# Patient Record
Sex: Female | Born: 1960 | Race: White | Hispanic: No | Smoking: Never smoker
Health system: Southern US, Community
[De-identification: ages and names within clinical notes are randomized; demographics above are authoritative.]

## PROBLEM LIST (undated history)

## (undated) DIAGNOSIS — Z8619 Personal history of other infectious and parasitic diseases: Secondary | ICD-10-CM

## (undated) DIAGNOSIS — N318 Other neuromuscular dysfunction of bladder: Secondary | ICD-10-CM

## (undated) DIAGNOSIS — L409 Psoriasis, unspecified: Secondary | ICD-10-CM

## (undated) DIAGNOSIS — R197 Diarrhea, unspecified: Secondary | ICD-10-CM

## (undated) DIAGNOSIS — N3281 Overactive bladder: Secondary | ICD-10-CM

## (undated) DIAGNOSIS — E669 Obesity, unspecified: Secondary | ICD-10-CM

## (undated) DIAGNOSIS — M1990 Unspecified osteoarthritis, unspecified site: Secondary | ICD-10-CM

## (undated) DIAGNOSIS — K219 Gastro-esophageal reflux disease without esophagitis: Secondary | ICD-10-CM

## (undated) HISTORY — DX: Obesity, unspecified: E66.9

## (undated) HISTORY — PX: OTHER SURGICAL HISTORY: SHX169

## (undated) HISTORY — DX: Overactive bladder: N32.81

## (undated) HISTORY — DX: Unspecified osteoarthritis, unspecified site: M19.90

## (undated) HISTORY — DX: Personal history of other infectious and parasitic diseases: Z86.19

## (undated) HISTORY — PX: KNEE ARTHROSCOPY: SUR90

## (undated) HISTORY — DX: Gastro-esophageal reflux disease without esophagitis: K21.9

---

## 1999-04-29 ENCOUNTER — Encounter: Payer: Self-pay | Admitting: Family Medicine

## 1999-04-29 ENCOUNTER — Encounter: Admission: RE | Admit: 1999-04-29 | Discharge: 1999-04-29 | Payer: Self-pay | Admitting: Family Medicine

## 1999-05-05 ENCOUNTER — Other Ambulatory Visit: Admission: RE | Admit: 1999-05-05 | Discharge: 1999-05-05 | Payer: Self-pay | Admitting: *Deleted

## 1999-05-12 ENCOUNTER — Encounter: Admission: RE | Admit: 1999-05-12 | Discharge: 1999-05-12 | Payer: Self-pay | Admitting: Family Medicine

## 1999-05-12 ENCOUNTER — Encounter: Payer: Self-pay | Admitting: Family Medicine

## 2000-04-08 ENCOUNTER — Encounter: Payer: Self-pay | Admitting: *Deleted

## 2000-04-08 ENCOUNTER — Encounter: Admission: RE | Admit: 2000-04-08 | Discharge: 2000-04-08 | Payer: Self-pay | Admitting: *Deleted

## 2000-05-04 ENCOUNTER — Other Ambulatory Visit: Admission: RE | Admit: 2000-05-04 | Discharge: 2000-05-04 | Payer: Self-pay | Admitting: *Deleted

## 2000-05-05 ENCOUNTER — Encounter (INDEPENDENT_AMBULATORY_CARE_PROVIDER_SITE_OTHER): Payer: Self-pay

## 2000-05-05 ENCOUNTER — Other Ambulatory Visit: Admission: RE | Admit: 2000-05-05 | Discharge: 2000-05-05 | Payer: Self-pay | Admitting: *Deleted

## 2001-04-11 ENCOUNTER — Encounter: Admission: RE | Admit: 2001-04-11 | Discharge: 2001-04-11 | Payer: Self-pay | Admitting: *Deleted

## 2001-04-11 ENCOUNTER — Encounter: Payer: Self-pay | Admitting: *Deleted

## 2001-06-21 ENCOUNTER — Other Ambulatory Visit: Admission: RE | Admit: 2001-06-21 | Discharge: 2001-06-21 | Payer: Self-pay | Admitting: *Deleted

## 2002-04-17 ENCOUNTER — Encounter: Admission: RE | Admit: 2002-04-17 | Discharge: 2002-04-17 | Payer: Self-pay | Admitting: Family Medicine

## 2002-04-17 ENCOUNTER — Encounter: Payer: Self-pay | Admitting: Family Medicine

## 2002-08-03 ENCOUNTER — Encounter: Payer: Self-pay | Admitting: Family Medicine

## 2002-08-03 ENCOUNTER — Encounter: Admission: RE | Admit: 2002-08-03 | Discharge: 2002-08-03 | Payer: Self-pay | Admitting: Family Medicine

## 2002-12-31 ENCOUNTER — Other Ambulatory Visit: Admission: RE | Admit: 2002-12-31 | Discharge: 2002-12-31 | Payer: Self-pay | Admitting: *Deleted

## 2004-02-06 ENCOUNTER — Encounter: Admission: RE | Admit: 2004-02-06 | Discharge: 2004-02-06 | Payer: Self-pay | Admitting: *Deleted

## 2004-02-06 ENCOUNTER — Other Ambulatory Visit: Admission: RE | Admit: 2004-02-06 | Discharge: 2004-02-06 | Payer: Self-pay | Admitting: *Deleted

## 2004-05-24 HISTORY — PX: ABDOMINAL HYSTERECTOMY: SHX81

## 2004-06-15 ENCOUNTER — Encounter (INDEPENDENT_AMBULATORY_CARE_PROVIDER_SITE_OTHER): Payer: Self-pay | Admitting: Specialist

## 2004-06-15 ENCOUNTER — Inpatient Hospital Stay (HOSPITAL_COMMUNITY): Admission: RE | Admit: 2004-06-15 | Discharge: 2004-06-17 | Payer: Self-pay | Admitting: Obstetrics and Gynecology

## 2005-09-27 ENCOUNTER — Encounter: Admission: RE | Admit: 2005-09-27 | Discharge: 2005-09-27 | Payer: Self-pay | Admitting: Obstetrics and Gynecology

## 2006-10-11 ENCOUNTER — Other Ambulatory Visit: Admission: RE | Admit: 2006-10-11 | Discharge: 2006-10-11 | Payer: Self-pay | Admitting: Obstetrics and Gynecology

## 2006-10-19 ENCOUNTER — Encounter: Admission: RE | Admit: 2006-10-19 | Discharge: 2006-10-19 | Payer: Self-pay | Admitting: Obstetrics and Gynecology

## 2007-10-17 ENCOUNTER — Other Ambulatory Visit: Admission: RE | Admit: 2007-10-17 | Discharge: 2007-10-17 | Payer: Self-pay | Admitting: Obstetrics and Gynecology

## 2009-09-01 ENCOUNTER — Ambulatory Visit: Payer: Self-pay | Admitting: Internal Medicine

## 2009-09-01 DIAGNOSIS — E669 Obesity, unspecified: Secondary | ICD-10-CM | POA: Insufficient documentation

## 2009-09-01 DIAGNOSIS — I8390 Asymptomatic varicose veins of unspecified lower extremity: Secondary | ICD-10-CM

## 2009-09-01 DIAGNOSIS — R358 Other polyuria: Secondary | ICD-10-CM

## 2009-09-05 ENCOUNTER — Encounter: Payer: Self-pay | Admitting: Internal Medicine

## 2009-09-05 LAB — CONVERTED CEMR LAB
Albumin: 4.8 g/dL (ref 3.5–5.2)
Creatinine, Ser: 1.01 mg/dL (ref 0.40–1.20)
Eosinophils Relative: 4 % (ref 0–5)
HCT: 43 % (ref 36.0–46.0)
HDL: 67 mg/dL (ref >39)
Hgb A1c MFr Bld: 5.6 % (ref <5.7)
LDL Cholesterol: 119 mg/dL — ABNORMAL HIGH (ref 0–99)
Lymphocytes Relative: 33 % (ref 12–46)
Lymphs Abs: 2.3 10*3/uL (ref 0.7–4.0)
MCHC: 33.3 g/dL (ref 30.0–36.0)
Neutrophils Relative %: 55 % (ref 43–77)
Platelets: 333 10*3/uL (ref 150–400)
Potassium: 4.6 meq/L (ref 3.5–5.3)
Saturation Ratios: 22 % (ref 20–55)
Sodium: 141 meq/L (ref 135–145)
TIBC: 352 ug/dL (ref 250–470)
TSH: 2.998 microintl units/mL (ref 0.350–4.500)
Total Bilirubin: 0.4 mg/dL (ref 0.3–1.2)
Total CHOL/HDL Ratio: 3
Triglycerides: 72 mg/dL (ref <150)
UIBC: 276 ug/dL
Vit D, 1,25-Dihydroxy: 38 (ref 30–89)
WBC: 6.8 10*3/uL (ref 4.0–10.5)

## 2009-09-07 ENCOUNTER — Encounter: Payer: Self-pay | Admitting: Internal Medicine

## 2009-09-15 ENCOUNTER — Encounter (INDEPENDENT_AMBULATORY_CARE_PROVIDER_SITE_OTHER): Payer: Self-pay | Admitting: *Deleted

## 2009-09-17 ENCOUNTER — Ambulatory Visit: Payer: Self-pay | Admitting: Gastroenterology

## 2009-10-01 ENCOUNTER — Ambulatory Visit: Payer: Self-pay | Admitting: Gastroenterology

## 2009-10-01 LAB — HM COLONOSCOPY

## 2009-12-12 ENCOUNTER — Ambulatory Visit: Payer: Self-pay | Admitting: Internal Medicine

## 2010-04-30 ENCOUNTER — Emergency Department (HOSPITAL_COMMUNITY)
Admission: EM | Admit: 2010-04-30 | Discharge: 2010-04-30 | Payer: Self-pay | Source: Home / Self Care | Admitting: Emergency Medicine

## 2010-06-23 NOTE — Miscellaneous (Signed)
Summary: Labs  Clinical Lists Changes  Orders: Added new Test order of T-Basic Metabolic Panel [phone]) - Signed Added new Test order of T-Hepatic Function [phone]) - Signed Added new Test order of T-Lipid Profile [id number]) - Signed Added new Test order of T-TSH [phone]) - Signed Added new Test order of T-CBC w/Diff [phone]) - Signed Added new Test order of T- Hemoglobin A1C ([id number]) - Signed Added new Test order of T-Iron [phone]) - Signed Added new Test order of T-Ferritin [phone]) - Signed Added new Test order of T-Vitamin D (25-Hydroxy) ([id number]) - Signed Added new Test order of TLB-IBC Pnl (Iron/FE;Transferrin) ([id number]-[id number]) - Signed

## 2010-06-23 NOTE — Letter (Signed)
   [place] at [facility] [address] [place], [place]  [address]  [place] Phone: [phone]      September 18, 2009   [facility] [address] [place], [place] [address]  RE:  LAB RESULTS  Dear  Ms. Parlett,  The following is an interpretation of your most recent lab tests.  Please take note of any instructions provided or changes to medications that have resulted from your lab work.  ELECTROLYTES:  Good - no changes needed  KIDNEY FUNCTION TESTS:  Good - no changes needed  LIVER FUNCTION TESTS:  Good - no changes needed  LIPID PANEL:  Good - no changes needed Triglyceride: 72   Cholesterol: 200   LDL: 119   HDL: 67   Chol/HDL%:  3.0 Ratio  THYROID STUDIES:  Thyroid studies normal TSH: 2.998      CBC:  Good - no changes needed  Iron studies - normal  Vit D level - normal     Sincerely Yours,    Dr. Thomos Lemons

## 2010-06-23 NOTE — Procedures (Signed)
Summary: Colonoscopy  Patient: [name] Note: All result statuses are Final unless otherwise noted.  Tests: (1) Colonoscopy (COL)   COL Colonoscopy           DONE     [facility]     [address]     [place], [place]  [address]           COLONOSCOPY PROCEDURE REPORT           PATIENT:  Yolanda Henderson, Yolanda Henderson  MR#:  [id number]     BIRTHDATE:  [birth year], 49 yrs. old  GENDER:  female     ENDOSCOPIST:  Rachael Fee, MD     REF. BY:  Thomos Lemons, DO     PROCEDURE DATE:  10/01/2009     PROCEDURE:  Diagnostic Colonoscopy     ASA CLASS:  Class II     INDICATIONS:  Routine Risk Screening     MEDICATIONS:   Fentanyl 75 mcg IV, Versed 6 mg IV           DESCRIPTION OF PROCEDURE:   After the risks benefits and     alternatives of the procedure were thoroughly explained, informed     consent was obtained.  Digital rectal exam was performed and     revealed no rectal masses.   The [id number] endoscope was     introduced through the anus and advanced to the cecum, which was     identified by both the appendix and ileocecal valve, without     limitations.  The quality of the prep was good, using MoviPrep.     The instrument was then slowly withdrawn as the colon was fully     examined.     <<PROCEDUREIMAGES>>           FINDINGS:  A normal appearing cecum, ileocecal valve, and     appendiceal orifice were identified. The ascending, hepatic     flexure, transverse, splenic flexure, descending, sigmoid colon,     and rectum appeared unremarkable (see image1, image2, and image3).     Retroflexed views in the rectum revealed no abnormalities.    The     scope was then withdrawn from the patient and the procedure     completed.           COMPLICATIONS:  None     ENDOSCOPIC IMPRESSION:     1) Normal colon     2) No polyps or cancers           RECOMMENDATIONS:     1) Continue current colorectal screening recommendations for     "routine risk" patients with a repeat  colonoscopy in 10 years.           REPEAT EXAM:  10 years           ______________________________     Rachael Fee, MD           n.     eSIGNED:   Rachael Fee at 10/01/2009 09:36 AM           [place], [id number]  Note: An exclamation mark (!) indicates a result that was not dispersed into the flowsheet. Document Creation Date: 10/01/2009 9:36 AM _______________________________________________________________________  (1) Order result status: Final Collection or observation date-time: 10/01/2009 09:30 Requested date-time:  Receipt date-time:  Reported date-time:  Referring Physician:   Ordering Physician: Rob Bunting [phone]) Specimen Source:  Source: Launa Grill Order Number: [id number] Lab site:  Appended Document: Colonoscopy    Clinical Lists Changes  Observations: Added new observation of COLONNXTDUE: 09/2019 (10/01/2009 14:40)

## 2010-06-23 NOTE — Assessment & Plan Note (Signed)
Summary: 3 MONTH FOLLOW UP/MHF rsc with pt from bump/mhf   Vital Signs:  Patient profile:   50 year old female Weight:      210 pounds BMI:     39.18 O2 Sat:      96 % on Room air Temp:     98.4 degrees F oral Pulse rate:   69 / minute Pulse rhythm:   regular Resp:     18 per minute BP sitting:   98 / 70  (right arm)  Vitals Entered By: Glendell Docker CMA (December 12, 2009 8:29 AM)  O2 Flow:  Room air CC: Rm 2- 3 Month Follow up Is Patient Diabetic? No Pain Assessment Patient in pain? no      Comments no concerns   Primary Care Provider:  DThomos Lemons DO  CC:  Rm 2- 3 Month Follow up.  History of Present Illness: 50 y/o white female for f/u  colonoscopy reviewed - no polyps  saw [facility] for right knee pain.  hx of OA of left knee better after receiving cortisone injection question meniscal tear.  some modest wt gain sedentary job takes care of husband when she gets home also takes care of miniature horses lost wt when she was on wt watches  legs still get tired on long car rides  Preventive Screening-Counseling & Management  Alcohol-Tobacco     Smoking Status: current  Allergies (verified): No Known Drug Allergies  Past History:  Past Medical History: Childhood chickenpox Obesity Overactive bladder     Past Surgical History: Abd cyst removed 15 yrs ago Hysterectomy 2006 - still has ovaries     Family History: Family History Hypertension Family History of Stroke F  Family hx of CAD lung ca - PGM Colon ca - MGF breast ca - no Mother has pacemaker - is our pt Nurse, children's)    Social History: 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   Lungs:  normal respiratory effort and normal breath sounds.   Heart:  normal rate, regular rhythm, no murmur, and no gallop.     Extremities:  trace left pedal edema and trace right pedal edema.  right lower ext varicosites     Impression & Recommendations:  Problem # 1:  VARICOSE VEINS, LOWER EXTREMITIES (ICD-454.9) use compression hose .  rx provided  Problem # 2:  OBESITY (ICD-278.00) Pt counseled on diet and exercise.  Complete Medication List: 1)  Detrol La 4 Mg Xr24h-cap (Tolterodine tartrate) .... Take 1 capsule by mouth once a day 2)  Multivitamins Tabs (Multiple vitamin) .... Take 1 tablet by mouth once a day  Patient Instructions: 1)  [email or web address] 2)  Limit to 1300-1400 cal per day 3)  Please schedule a follow-up appointment in 1 year.  Current Allergies (reviewed today): No known allergies

## 2010-06-23 NOTE — Miscellaneous (Signed)
Summary: [facility]  Clinical Lists Changes  Medications: Added new medication of MOVIPREP 100 GM  SOLR (PEG-KCL-NACL-NASULF-NA ASC-C) As per prep instructions. - Signed Rx of MOVIPREP 100 GM  SOLR (PEG-KCL-NACL-NASULF-NA ASC-C) As per prep instructions.;  #1 x 0;  Signed;  Entered by: Ezra Sites RN;  Authorized by: Rachael Fee MD;  Method used: Electronically to [facility] [address] # [id number]*, [address] [place], [place], [place]  [address], Ph: [phone] or [phone], Fax: [phone] Observations: Added new observation of NKA: T (09/17/2009 9:55)    Prescriptions: MOVIPREP 100 GM  SOLR (PEG-KCL-NACL-NASULF-NA ASC-C) As per prep instructions.  #1 x 0   Entered by:   Ezra Sites RN   Authorized by:   Rachael Fee MD   Signed by:   Ezra Sites RN on 09/17/2009   Method used:   Electronically to        [facility] [address] [id number]* (retail)       [address]       [place], [place]  [address]       Ph: [phone] or [phone]       Fax: [phone]   RxID:   [id number]

## 2010-06-23 NOTE — Letter (Signed)
Summary: [facility] Instructions  [facility]  [address] [place], [place] [address]   Phone: [phone]  Fax: [phone]       Yolanda Henderson    December 18, 1960    MRN: [id number]        Procedure Day Dorna Bloom:  Wednesday 10/01/09     Arrival Time: 8:30 am      Procedure Time: 9:30 am     Location of Procedure:                    _x _  [facility] (4th Floor)                        PREPARATION FOR COLONOSCOPY WITH MOVIPREP   Starting 5 days prior to your procedure Friday 5/6 do not eat nuts, seeds, popcorn, corn, beans, peas,  salads, or any raw vegetables.  Do not take any fiber supplements (e.g. Metamucil, Citrucel, and Benefiber).  THE DAY BEFORE YOUR PROCEDURE         DATE: Tuesday 5/10  1.  Drink clear liquids the entire day-NO SOLID FOOD  2.  Do not drink anything colored red or purple.  Avoid juices with pulp.  No orange juice.  3.  Drink at least 64 oz. (8 glasses) of fluid/clear liquids during the day to prevent dehydration and help the prep work efficiently.  CLEAR LIQUIDS INCLUDE: Water Jello Ice Popsicles Tea (sugar ok, no milk/cream) Powdered fruit flavored drinks Coffee (sugar ok, no milk/cream) Gatorade Juice: apple, white grape, white cranberry  Lemonade Clear bullion, consomm, broth Carbonated beverages (any kind) Strained chicken noodle soup Hard Candy                             4.  In the morning, mix first dose of MoviPrep solution:    Empty 1 Pouch A and 1 Pouch B into the disposable container    Add lukewarm drinking water to the top line of the container. Mix to dissolve    Refrigerate (mixed solution should be used within 24 hrs)  5.  Begin drinking the prep at 5:00 p.m. The MoviPrep container is divided by 4 marks.   Every 15 minutes drink the solution down to the next mark (approximately 8 oz) until the full liter is complete.   6.  Follow completed prep with 16 oz of clear liquid of your choice (Nothing  red or purple).  Continue to drink clear liquids until bedtime.  7.  Before going to bed, mix second dose of MoviPrep solution:    Empty 1 Pouch A and 1 Pouch B into the disposable container    Add lukewarm drinking water to the top line of the container. Mix to dissolve    Refrigerate  THE DAY OF YOUR PROCEDURE      DATE: Wednesday 5/11  Beginning at 4:30 a.m. (5 hours before procedure):         1. Every 15 minutes, drink the solution down to the next mark (approx 8 oz) until the full liter is complete.  2. Follow completed prep with 16 oz. of clear liquid of your choice.    3. You may drink clear liquids until 7:30 am (2 HOURS BEFORE PROCEDURE).   MEDICATION INSTRUCTIONS  Unless otherwise instructed, you should take regular prescription medications with a small sip of water   as early as possible the morning of  your procedure.           OTHER INSTRUCTIONS  You will need a responsible adult at least 51 years of age to accompany you and drive you home.   This person must remain in the waiting room during your procedure.  Wear loose fitting clothing that is easily removed.  Leave jewelry and other valuables at home.  However, you may wish to bring a book to read or  an iPod/MP3 player to listen to music as you wait for your procedure to start.  Remove all body piercing jewelry and leave at home.  Total time from sign-in until discharge is approximately 2-3 hours.  You should go home directly after your procedure and rest.  You can resume normal activities the  day after your procedure.  The day of your procedure you should not:   Drive   Make legal decisions   Operate machinery   Drink alcohol   Return to work  You will receive specific instructions about eating, activities and medications before you leave.    The above instructions have been reviewed and explained to me by   Ezra Sites RN  September 17, 2009 10:46 AM     I fully understand and can  verbalize these instructions _____________________________ Date _________

## 2010-06-23 NOTE — Assessment & Plan Note (Signed)
Summary: new to est/mhf   Vital Signs:  Patient profile:   50 year old female Height:      61.5 inches Weight:      206.75 pounds BMI:     38.57 O2 Sat:      100 % on Room air Temp:     98.1 degrees F oral Pulse rate:   56 / minute Pulse rhythm:   regular Resp:     16 per minute BP sitting:   100 / 80  (right arm) Cuff size:   regular  Vitals Entered By: Glendell Docker CMA (September 01, 2009 9:42 AM)  O2 Flow:  Room air CC: Rm 2- New Patient    Primary Care Provider:  Dondra Spry DO  CC:  Rm 2- New Patient .  History of Present Illness: 50 y/o white female to establish and for routine cpx  Prev followed by Dr. Melrose Nakayama at [facility]  Dr. Cherylann Ratel (cardiologist) - stress test, 2 D Echo 2008-2009  legs twitch - over the years.   more bothersome recently cramping sensation,  tight sensation worse on long car rides feels likes she has to move her legs  health screening at work last year - she denies hyperglycemia or elevated cholesterol  Preventive Screening-Counseling & Management  Alcohol-Tobacco     Alcohol drinks/day: 0     Smoking Status: never  Caffeine-Diet-Exercise     Caffeine use/day: 2 beverages daily     Does Patient Exercise: no  Allergies (verified): No Known Drug Allergies  Past History:  Past Medical History: Childhood chickenpox Obesity Overactive bladder   Past Surgical History: Abd cyst removed 15 yrs ago Hysterectomy 2006 - still has ovaries    Family History: Family History Hypertension Family History of Stroke F  Family hx of CAD lung ca - PGM Colon ca - MGF breast ca - no Mother has pacemaker - is our pt Nurse, children's)   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  The patient denies chest  pain, syncope, dyspnea on exertion, headaches, abdominal pain, melena, hematochezia, severe indigestion/heartburn, and depression.         lower ext swelling,    Physical Exam  General:  alert, well-developed, and well-nourished.   Head:  normocephalic and atraumatic.   Eyes:  pupils equal and pupils round.   Ears:  R ear normal and L ear normal.   Lungs:  normal respiratory effort and normal breath sounds.   Heart:  normal rate, regular rhythm, no murmur, and no gallop.     Impression & Recommendations:  Problem # 1:  HEALTH MAINTENANCE EXAM (ICD-V70.0) Reviewed adult health maintenance protocols.  Pt to f/u with her GYN for annual PAP and Pelvic.  refer to GI for screening colonoscopy. Pt counseled on diet and exercise.  Orders: Gastroenterology Referral (GI)  Mammogram: normal (09/12/2007) Pap smear: normal (09/12/2007) Td Booster: Tdap (09/12/2007)   Flu Vax: Historical (03/11/2009)     Problem # 2:  POLYURIA (ONG-295.28) Assessment: Deteriorated she has hx of overactive bladder.  continue detrol.  rule out diabetes.    Problem # 3:  RESTLESS LEG SYNDROME (ICD-333.94) chronic hx of leg twitching.   probable RLS.   check iron studies.  consider low  dose gabapentin or  mirapex  Complete Medication List: 1)  Detrol 2 Mg Tabs (Tolterodine tartrate) .... Take 1 tablet by mouth once a day 2)  Multivitamins Tabs (Multiple vitamin) .... Take 1 tablet by mouth once a day  Patient Instructions: 1)  Please schedule a follow-up appointment in 3 months. 2)  [email or web address] 3)  Limit your calories to 1300-1400 cal per day. 4)  BMP prior to visit, ICD-9: 5)  Hepatic Panel prior to visit, ICD-9: 6)  Lipid Panel prior to visit, ICD-9: 7)  TSH prior to visit, ICD-9: 8)  CBC w/ Diff prior to visit, ICD-9: 9)  HbgA1C prior to visit, ICD-9: 10)  Serum iron, TIBC, and ferritin: 11)  Vit D Level:   12)  Please return for lab work within 1 week (fasting)  Current  Allergies (reviewed today): No known allergies   Preventive Care Screening  Last Tetanus Booster:    Date:  09/12/2007    Results:  Tdap   Mammogram:    Date:  09/12/2007    Results:  normal   Pap Smear:    Date:  09/12/2007    Results:  normal     Immunization History:  Influenza Immunization History:    Influenza:  historical (03/11/2009)

## 2010-08-03 ENCOUNTER — Other Ambulatory Visit: Payer: Self-pay | Admitting: Internal Medicine

## 2010-08-03 DIAGNOSIS — Z1231 Encounter for screening mammogram for malignant neoplasm of breast: Secondary | ICD-10-CM

## 2010-08-04 ENCOUNTER — Ambulatory Visit
Admission: RE | Admit: 2010-08-04 | Discharge: 2010-08-04 | Disposition: A | Discharge status: Home / Self Care | Payer: BC Managed Care – PPO | Source: Ambulatory Visit | Attending: Internal Medicine | Admitting: Internal Medicine

## 2010-08-04 DIAGNOSIS — Z1231 Encounter for screening mammogram for malignant neoplasm of breast: Secondary | ICD-10-CM

## 2010-08-04 LAB — URINALYSIS, ROUTINE W REFLEX MICROSCOPIC
Bilirubin Urine: NEGATIVE
Glucose, UA: NEGATIVE mg/dL
Ketones, ur: NEGATIVE mg/dL
Nitrite: NEGATIVE
Protein, ur: NEGATIVE mg/dL
Urobilinogen, UA: 0.2 mg/dL (ref 0.0–1.0)

## 2010-08-04 LAB — BASIC METABOLIC PANEL
CO2: 26 mEq/L (ref 19–32)
Calcium: 9 mg/dL (ref 8.4–10.5)
Chloride: 105 mEq/L (ref 96–112)
Creatinine, Ser: 1.33 mg/dL — ABNORMAL HIGH (ref 0.4–1.2)
Potassium: 3.7 mEq/L (ref 3.5–5.1)

## 2010-08-04 LAB — CBC: MCV: 90.6 fL (ref 78.0–100.0)

## 2010-08-04 LAB — DIFFERENTIAL
Basophils Relative: 0 % (ref 0–1)
Eosinophils Absolute: 0.2 10*3/uL (ref 0.0–0.7)

## 2010-08-04 LAB — URINE MICROSCOPIC-ADD ON

## 2010-08-04 LAB — POCT PREGNANCY, URINE: Preg Test, Ur: NEGATIVE

## 2010-08-05 ENCOUNTER — Other Ambulatory Visit: Payer: Self-pay | Admitting: Family Medicine

## 2010-08-12 ENCOUNTER — Other Ambulatory Visit: Payer: BC Managed Care – PPO

## 2010-08-17 ENCOUNTER — Ambulatory Visit
Admission: RE | Admit: 2010-08-17 | Discharge: 2010-08-17 | Disposition: A | Discharge status: Home / Self Care | Payer: BC Managed Care – PPO | Source: Ambulatory Visit | Attending: Family Medicine | Admitting: Family Medicine

## 2010-08-17 DIAGNOSIS — N959 Unspecified menopausal and perimenopausal disorder: Secondary | ICD-10-CM

## 2010-10-09 NOTE — Op Note (Signed)
Yolanda Henderson, Yolanda Henderson             ACCOUNT NO.:  [id number]   MEDICAL RECORD NO.:  [id number]          PATIENT TYPE:  INP   LOCATION:  0012                         FACILITY:  [facility]   PHYSICIAN:  Cynthia P. Romine, M.D.DATE OF BIRTH:  Aug 01, 1960   DATE OF PROCEDURE:  06/15/2004  DATE OF DISCHARGE:                                 OPERATIVE REPORT   PREOPERATIVE DIAGNOSIS:  Menorrhagia, uterine fibroids.   POSTOPERATIVE DIAGNOSIS:  Menorrhagia, uterine fibroids, pathology pending.   PROCEDURE:  Total abdominal hysterectomy.   SURGEON:  Cynthia P. Romine, M.D.   ASSISTANT:  Andres Ege, M.D.   ANESTHESIA:  General endotracheal anesthesia.   ESTIMATED BLOOD LOSS:  300 mL.   COMPLICATIONS:  None.   DESCRIPTION OF PROCEDURE:  The patient was taken to the operating room and  after the induction of adequate general endotracheal anesthesia, was placed  in the supine position and prepped and draped in the usual fashion.  There  was quite a large pannus, it was necessary to lower the head and lower the  feet so that the pannus did not hang over the incision site during the  surgery.  A Foley catheter was inserted.  An incision was made just above  the lower most crease in the pannus and was carried down through the  subcutaneous fat to the fascia.  There was quite a bit of subcutaneous fat.  It was a rather deep hole between the skin and the fascia.  The fascia was  nicked with the Bovie and opened transversely with scissors.  The underlying  rectus muscles were separated sharply in the midline.  Midline peritoneum  was elevated between hemostats and entered atraumatically.  The upper  abdomen was explored.  The liver edge was smooth.  The gallbladder contained  no stones.  There was no periaortic or hepatic lymphadenopathy.  In the  pelvis, there was an enlarged uterus with an anterior 4 cm lower uterine  segment fibroid.  The ovaries and tubes appeared normal.  There were  some  adhesions of fat from the omentum to the ovary on the right which was lysed.  Exposure was quite difficult because of the increased amount of fatty tissue  and multiple packs were necessary to pack away the intestines and the fat  out of the surgical site.  Long Ellin Goodie were used to grasp the uterine cornu.  The round ligaments were sutured and cut with the Bovie.  Anterior leaf of  the broad ligament was taken down sharply as was the posterior leaf.  A  window was created in the mesosalpinx and the pedicle containing the tube  and the utero-ovarian ligament was clamped, cut and doubly tied  on each  side.  The uterine arteries were skeletonized on each side, clamped, cut and  doubly tied.  The hysterectomy proceeded down the cardinal ligament  clamping, cutting and tying in sequence using 0 chromic.  The uterosacral  ligaments were taken as a separate bite.  Upon taking the uterosacral  ligaments, the angle of the vaginal was entered and the specimen was removed  with Satinsky  scissors.  It should be noted that after clamping the uterine  arteries, because of difficulty with visualization of the cervix and the  bladder with the 4 cm anterior fibroid, the corpus was removed separately  from the cervix, then the cervix was sent separately as well.  The vagina  was grasped with Kochers.  Angle sutures were placed in each of the right  and left sides.  The vagina was closed with interrupted figure-of-eight  sutures and 0 Vicryl.  The pelvis was irrigated copiously and felt to be  hemostatic.  The ovaries were tied up to the renal ligaments on each side.  Again the pelvis was inspected and felt to be hemostatic.  The packs were  removed.  The bowel was allowed to return to its anatomic position.  Peritoneum was elevated between hemostats and closed in running fashion  using 2-0 chromic.  A pain pump catheter was put in the subfascial planes  and the fascia was closed in each angle toward  midline using 0 Vicryl.  Lidocaine 1% 10 mL was injected in the subfascial planes.  The subcutaneous  plane, another pain pump catheter was placed.  Hemostasis was achieved with  the Bovie and the subcutaneous space was irrigated.  The skin was closed  with staples and 5 mL of 1% lidocaine was injected into the subcutaneous  space.  The 0.5% Marcaine was used in the pain pump and it was set to  deliver a total of 2 mL per hour of 0.5% Marcaine.  Closed the skin with  staples and dressing was applied.  The procedure was terminated and the  patient was taken to the recovery room in satisfactory condition.                                               ______________________________  Edwena Felty. Romine, M.D.    CPR/MEDQ  D:  06/15/2004  T:  06/15/2004  Job:  [id number]

## 2010-10-09 NOTE — Discharge Summary (Signed)
Yolanda Henderson, Yolanda Henderson             ACCOUNT NO.:  [id number]   MEDICAL RECORD NO.:  [id number]          PATIENT TYPE:  INP   LOCATION:  0449                         FACILITY:  [facility]   PHYSICIAN:  Cynthia P. Romine, M.D.DATE OF BIRTH:  17-May-1961   DATE OF ADMISSION:  06/15/2004  DATE OF DISCHARGE:  06/17/2004                                 DISCHARGE SUMMARY   DISCHARGE DIAGNOSES:  1.  Leiomyomata.  2.  Menorrhagia.   HISTORY OF PRESENT ILLNESS:  This is a 50 year old female with known  fibroids and menorrhagia who was admitted for a total abdominal  hysterectomy.  Her uterus was felt on exam to be 10 x 6 x 6 cm.   HOSPITAL COURSE:  On June 15, 2004 she was admitted and underwent a total  abdominal hysterectomy.  There were no complications.  Estimated blood loss  was 300 cc.  Postoperatively the patient did well and was stable for  discharge on postoperative day #2, afebrile and in good condition.  Discharge H&H was 10.2 and 31.  She was given a prescription for Percocet 5  mg, #20, one p.o. q.4h. p.r.n. pain, given full discharge instructions  regarding pelvic rest, and follow-up in the office.  Pathology report did  confirm leiomyomata, submucosal intramural, and subserosal.  The uterine  weight was 242 g.  There was slight cervicitis.      CPR/MEDQ  D:  08/24/2004  T:  08/24/2004  Job:  [id number]   cc:   Edwena Felty. Romine, M.D.  [address]  [place]  [place] [address]  Fax: [phone]

## 2011-01-28 ENCOUNTER — Encounter: Payer: Self-pay | Admitting: Internal Medicine

## 2011-01-29 ENCOUNTER — Ambulatory Visit (INDEPENDENT_AMBULATORY_CARE_PROVIDER_SITE_OTHER): Payer: BC Managed Care – PPO | Admitting: Internal Medicine

## 2011-01-29 ENCOUNTER — Encounter: Payer: Self-pay | Admitting: Internal Medicine

## 2011-01-29 VITALS — BP 124/90 | Temp 98.5°F | Ht 62.0 in | Wt 235.0 lb

## 2011-01-29 DIAGNOSIS — F341 Dysthymic disorder: Secondary | ICD-10-CM

## 2011-01-29 DIAGNOSIS — F419 Anxiety disorder, unspecified: Secondary | ICD-10-CM

## 2011-01-29 DIAGNOSIS — R197 Diarrhea, unspecified: Secondary | ICD-10-CM

## 2011-01-29 DIAGNOSIS — F32A Depression, unspecified: Secondary | ICD-10-CM

## 2011-01-29 LAB — BASIC METABOLIC PANEL
BUN: 20 mg/dL (ref 6–23)
Calcium: 8.6 mg/dL (ref 8.4–10.5)
Chloride: 103 mEq/L (ref 96–112)
Creat: 1.03 mg/dL (ref 0.50–1.10)
Sodium: 139 mEq/L (ref 135–145)

## 2011-01-29 MED ORDER — CITALOPRAM HYDROBROMIDE 10 MG PO TABS: ORAL_TABLET | ORAL | Status: DC

## 2011-01-29 NOTE — Assessment & Plan Note (Signed)
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  She also thinks menopausal symptoms are contributing.  Trial of citalopram.

## 2011-01-29 NOTE — Patient Instructions (Addendum)
Use imodium over the counter for now Increase your fluid intake that contain electrolytes Try to follow BRAT diet (Banannas, rice, applesauce, dry toast).  Avoid fatty foods Our office will contact you re:  Testing results Please call our office if your symptoms do not improve or gets worse.

## 2011-01-29 NOTE — Progress Notes (Signed)
Subjective:    Patient ID: Yolanda Henderson "Yolanda Henderson, female    DOB: Apr 10, 1961, 50 y.o.   MRN: [id number]  HPI 50 year old white female complains of chronic diarrhea. She has had loose stools that she describes for years. However over the last several weeks there has been an increase in frequency. Patient is experiencing  explosive diarrhea approximately 8 times a day. She denies any known trigger. The last time she traveled outside the country was 2 years ago [place]. She does note that her loose stools started after her trip to [place]. She denies blood in her stool. No mucus in her stools. She denies fevers or chills. It is not associated with specific food intake.  She denies recent antibiotic use.  Her other main complaint today is depression/anxiety. 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 She has stressful home life.  She has been more short fused lately than usual. Her symptoms are not severe. She denies any suicidal or homicidal ideation. She has mild intermittent insomnia.  No significant change in appetite.   Review of Systems At her last office visit in July 2011 patient weight 210 pounds.  She currently weighs 235 pounds    Past Medical History  Diagnosis Date  . History of chickenpox   . Obesity   . Overactive bladder     History   Social History  . Marital Status: Married    Spouse Name: N/A    Number of Children: N/A  . Years of Education: N/A   Occupational History  . Not on file.   Social History Main Topics  . Smoking status: Never Smoker   . Smokeless tobacco: Not on file  . Alcohol Use: No  . Drug Use: Not on file  . Sexually Active: Not on file   Other Topics Concern  . 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 Abd cyst     removed 15 years ago  . Abdominal hysterectomy 2006    still has ovaries    Family History    Problem Relation Age of Onset  . Hypertension    . Stroke    . Coronary artery disease    . Lung cancer Paternal Grandmother   . Colon cancer Maternal Grandfather   . Other Mother     pacemaker    No Known Allergies  No current outpatient prescriptions on file prior to visit.    BP 124/90  Temp(Src) 98.5 F (36.9 C) (Oral)  Ht 5\' 2"  (1.575 m)  Wt 235 lb (106.595 kg)  BMI 42.98 kg/m2    Objective:   Physical Exam   Constitutional: pleasant, NAD Head: Normocephalic and atraumatic.  Right Ear: External ear normal.  Left Ear: External ear normal.  Mouth/Throat: Oropharynx is clear and moist.  Eyes: Conjunctivae are normal. Pupils are equal, round, and reactive to light.  Neck: Normal range of motion. Neck supple. No thyromegaly present. No carotid bruit Cardiovascular: Normal rate, regular rhythm and normal heart sounds.  Exam reveals no gallop and no friction rub.   No murmur heard. Pulmonary/Chest: Effort normal and breath sounds normal.  No wheezes. No rales.  Abdominal: Soft. Bowel sounds are normal. No mass. There is no tenderness.  Neurological: Alert. No cranial nerve deficit.  Skin: Skin is warm and dry.  Psychiatric: tearful     Assessment & Plan:

## 2011-01-29 NOTE — Assessment & Plan Note (Addendum)
Acute diarrhea.  Rule out infectious cause.  Use imodium and follow BRAT diet. Increase fluids.  Check BMET  02/05/2011 Pt still having frequent bowel movements. C. difficile, Ova parasites, sedimentation rate, CBC are all negative I suggest referral to gastroenterology for further evaluation -Dr. Christella Hartigan Continue Imodium for now

## 2011-01-30 LAB — CBC WITH DIFFERENTIAL/PLATELET
Basophils Relative: 1 % (ref 0–1)
Eosinophils Relative: 4 % (ref 0–5)
Hemoglobin: 13.6 g/dL (ref 12.0–15.0)
Lymphs Abs: 2.7 10*3/uL (ref 0.7–4.0)
MCH: 30.2 pg (ref 26.0–34.0)
Monocytes Relative: 9 % (ref 3–12)
Neutro Abs: 5.5 10*3/uL (ref 1.7–7.7)
Platelets: 352 10*3/uL (ref 150–400)
RBC: 4.51 MIL/uL (ref 3.87–5.11)
RDW: 13.2 % (ref 11.5–15.5)
WBC: 9.5 10*3/uL (ref 4.0–10.5)

## 2011-01-30 LAB — SEDIMENTATION RATE: Sed Rate: 9 mm/hr (ref 0–22)

## 2011-01-30 LAB — T4, FREE: Free T4: 1.08 ng/dL (ref 0.80–1.80)

## 2011-02-04 LAB — OVA AND PARASITE SCREEN

## 2011-02-04 LAB — CLOSTRIDIUM DIFFICILE BY PCR: Toxigenic C. Difficile by PCR: NOT DETECTED

## 2011-02-05 ENCOUNTER — Encounter: Payer: Self-pay | Admitting: Gastroenterology

## 2011-02-05 NOTE — Progress Notes (Signed)
Addended by: Simeon Craft on: 02/05/2011 01:39 PM   Modules accepted: Orders

## 2011-02-07 LAB — STOOL CULTURE

## 2011-02-08 ENCOUNTER — Other Ambulatory Visit: Payer: Self-pay | Admitting: Internal Medicine

## 2011-02-10 ENCOUNTER — Other Ambulatory Visit: Payer: Self-pay | Admitting: Internal Medicine

## 2011-03-08 ENCOUNTER — Ambulatory Visit (INDEPENDENT_AMBULATORY_CARE_PROVIDER_SITE_OTHER): Payer: BC Managed Care – PPO | Admitting: Gastroenterology

## 2011-03-08 ENCOUNTER — Encounter: Payer: Self-pay | Admitting: Gastroenterology

## 2011-03-08 VITALS — BP 128/72 | HR 64 | Ht 62.0 in | Wt 235.0 lb

## 2011-03-08 DIAGNOSIS — R197 Diarrhea, unspecified: Secondary | ICD-10-CM

## 2011-03-08 NOTE — Progress Notes (Signed)
HPI: This is a   very pleasant 50 year old woman that I saw about a year ago at the time of a routine screening colonoscopy.  She has had loose stools for quite a long time.  Was having loose stools in past, but lately explosive over the past month.  No Abx in past 2-3 months.  She's had a lot urgency.  2-3 times in AM, 3-4 times throughout the rest of the day.  Celexa started 1-2 months ago.  No rectal bleeding.  Colonoscopy 09/2009: normal, receommended fro recall in 10 years.  She has been drinking more cafeine lately, up to 3-4 cups daily.  Started about a year ago, but has increased.  Very, VERY rare etoh.  Weight has been stable.    CBCD, sed rate, electrolytes , kidney function, thyroid function - all normal in early September.  Stool testing for C. difficile, ova parasites, routine stool culture was all negative.       Review of systems: Pertinent positive and negative review of systems were noted in the above HPI section.  All other review of systems was otherwise negative.   Past Medical History  Diagnosis Date  . History of chickenpox   . Obesity   . Overactive bladder   . Arthritis   . GERD (gastroesophageal reflux disease)     Past Surgical History  Procedure Date  . Abd cyst     removed 15 years ago  . Abdominal hysterectomy 2006    still has ovaries     reports that she has never smoked. She has never used smokeless tobacco. She reports that she does not drink alcohol or use illicit drugs.  family history includes Colon cancer in her maternal grandfather; Coronary artery disease in her mother; Hypertension in her mother; Lung cancer in her paternal grandmother; and Other in her mother.    Current Medications, Allergies were all reviewed with the patient via [facility] electronic medical record system.    Physical Exam: BP 128/72  Pulse 64  Ht 5\' 2"  (1.575 m)  Wt 235 lb (106.595 kg)  BMI 42.98 kg/m2 Constitutional: generally  well-appearing Obese Psychiatric: alert and oriented x3 Eyes: extraocular movements intact Mouth: oral pharynx moist, no lesions Neck: supple no lymphadenopathy Cardiovascular: heart regular rate and rhythm Lungs: clear to auscultation bilaterally Abdomen: soft, nontender, nondistended, no obvious ascites, no peritoneal signs, normal bowel sounds Extremities: no lower extremity edema bilaterally Skin: no lesions on visible extremities    Assessment and plan: 50 y.o. female with loose stools, diarrhea  Given her workup to date I think it is unlikely that she has an infectious or inflammatory process. She has been increasing her caffeine and perhaps that is why her stools are looser. She will cut back on her caffeine and will call this office in about 2 weeks to report on her symptoms. If that is not helpful then I would likely recommend she start taking one Imodium every morning shortly after she wakes up. If that also is not helpful then we would need to reconsider repeat colonoscopy.

## 2011-03-08 NOTE — Patient Instructions (Signed)
Cut back on caffeine, call Dr. Christella Hartigan' office in 2 weeks to report on your symptoms. Bleeding, worsening of diarrhea, significant changes in bowels... Then call sooner. A copy of this information will be made available to Dr. Artist Pais.

## 2011-04-17 ENCOUNTER — Other Ambulatory Visit: Payer: Self-pay | Admitting: Orthopedic Surgery

## 2011-05-11 ENCOUNTER — Telehealth: Payer: Self-pay | Admitting: Internal Medicine

## 2011-05-11 MED ORDER — CITALOPRAM HYDROBROMIDE 10 MG PO TABS: 10.0000 mg | ORAL_TABLET | Freq: Every day | ORAL | Status: DC

## 2011-05-11 NOTE — Telephone Encounter (Signed)
Pt requesting refill on    citalopram (CELEXA) 10 MG tablet    [facility] [address]

## 2011-05-11 NOTE — Telephone Encounter (Signed)
rx sent in electronically 

## 2011-05-17 ENCOUNTER — Other Ambulatory Visit: Payer: Self-pay | Admitting: *Deleted

## 2011-05-17 MED ORDER — CITALOPRAM HYDROBROMIDE 10 MG PO TABS: 10.0000 mg | ORAL_TABLET | Freq: Every day | ORAL | Status: DC

## 2011-05-26 ENCOUNTER — Ambulatory Visit (HOSPITAL_BASED_OUTPATIENT_CLINIC_OR_DEPARTMENT_OTHER)
Admission: RE | Admit: 2011-05-26 | Payer: BC Managed Care – PPO | Source: Ambulatory Visit | Admitting: Orthopedic Surgery

## 2011-05-26 ENCOUNTER — Encounter (HOSPITAL_BASED_OUTPATIENT_CLINIC_OR_DEPARTMENT_OTHER): Admission: RE | Payer: Self-pay | Source: Ambulatory Visit

## 2011-05-26 SURGERY — ARTHROSCOPY, KNEE
Anesthesia: Choice | Laterality: Right

## 2012-01-12 ENCOUNTER — Other Ambulatory Visit: Payer: Self-pay | Admitting: Family Medicine

## 2012-01-12 DIAGNOSIS — Z1231 Encounter for screening mammogram for malignant neoplasm of breast: Secondary | ICD-10-CM

## 2012-01-31 ENCOUNTER — Ambulatory Visit: Payer: BC Managed Care – PPO

## 2012-03-20 ENCOUNTER — Ambulatory Visit
Admission: RE | Admit: 2012-03-20 | Discharge: 2012-03-20 | Disposition: A | Discharge status: Home / Self Care | Payer: BC Managed Care – PPO | Source: Ambulatory Visit | Attending: Family Medicine | Admitting: Family Medicine

## 2012-03-20 ENCOUNTER — Other Ambulatory Visit: Payer: Self-pay | Admitting: Obstetrics and Gynecology

## 2012-03-20 DIAGNOSIS — Z1231 Encounter for screening mammogram for malignant neoplasm of breast: Secondary | ICD-10-CM

## 2012-04-26 ENCOUNTER — Ambulatory Visit: Payer: BC Managed Care – PPO | Admitting: Internal Medicine

## 2012-06-27 ENCOUNTER — Other Ambulatory Visit: Payer: Self-pay | Admitting: Orthopedic Surgery

## 2012-06-27 MED ORDER — DEXAMETHASONE SODIUM PHOSPHATE 10 MG/ML IJ SOLN: 10.0000 mg | Freq: Once | INTRAMUSCULAR | Status: DC

## 2012-06-27 MED ORDER — BUPIVACAINE LIPOSOME 1.3 % IJ SUSP: 20.0000 mL | Freq: Once | INTRAMUSCULAR | Status: DC

## 2012-06-27 NOTE — Progress Notes (Signed)
Preoperative surgical orders have been place into the [facility] [place] [facility] 06/27/2012, 7:43 AM  by Patrica Duel for surgery on 07/31/2012.  Preop Total Knee orders including Experal, IV Tylenol, and IV Decadron as long as there are no contraindications to the above medications. Avel Peace, PA-C

## 2012-07-19 ENCOUNTER — Encounter (HOSPITAL_COMMUNITY): Payer: Self-pay | Admitting: Pharmacy Technician

## 2012-07-27 ENCOUNTER — Encounter (HOSPITAL_COMMUNITY)
Admission: RE | Admit: 2012-07-27 | Discharge: 2012-07-27 | Disposition: A | Discharge status: Home / Self Care | Payer: BC Managed Care – PPO | Source: Ambulatory Visit | Attending: Orthopedic Surgery | Admitting: Orthopedic Surgery

## 2012-07-27 ENCOUNTER — Other Ambulatory Visit (HOSPITAL_COMMUNITY): Payer: Self-pay | Admitting: *Deleted

## 2012-07-27 ENCOUNTER — Encounter (HOSPITAL_COMMUNITY): Payer: Self-pay

## 2012-07-27 HISTORY — DX: Psoriasis, unspecified: L40.9

## 2012-07-27 HISTORY — DX: Diarrhea, unspecified: R19.7

## 2012-07-27 HISTORY — DX: Other neuromuscular dysfunction of bladder: N31.8

## 2012-07-27 LAB — PROTIME-INR
INR: 0.89 (ref 0.00–1.49)
Prothrombin Time: 12 seconds (ref 11.6–15.2)

## 2012-07-27 LAB — URINE MICROSCOPIC-ADD ON

## 2012-07-27 LAB — CBC
HCT: 43.8 % (ref 36.0–46.0)
Hemoglobin: 14.4 g/dL (ref 12.0–15.0)
MCH: 30.3 pg (ref 26.0–34.0)
MCHC: 32.9 g/dL (ref 30.0–36.0)

## 2012-07-27 LAB — URINALYSIS, ROUTINE W REFLEX MICROSCOPIC
Bilirubin Urine: NEGATIVE
Leukocytes, UA: NEGATIVE
Nitrite: NEGATIVE
Protein, ur: NEGATIVE mg/dL
Urobilinogen, UA: 0.2 mg/dL (ref 0.0–1.0)

## 2012-07-27 LAB — ABO/RH: ABO/RH(D): O POS

## 2012-07-27 LAB — SURGICAL PCR SCREEN: Staphylococcus aureus: NEGATIVE

## 2012-07-27 LAB — COMPREHENSIVE METABOLIC PANEL
BUN: 20 mg/dL (ref 6–23)
Calcium: 9 mg/dL (ref 8.4–10.5)
Creatinine, Ser: 0.83 mg/dL (ref 0.50–1.10)
GFR calc Af Amer: >90 mL/min (ref >90)
Glucose, Bld: 80 mg/dL (ref 70–99)
Sodium: 140 mEq/L (ref 135–145)
Total Protein: 7 g/dL (ref 6.0–8.3)

## 2012-07-27 NOTE — Patient Instructions (Signed)
Yolanda Henderson  07/27/2012                           YOUR PROCEDURE IS SCHEDULED ON: 07/31/12               PLEASE REPORT TO SHORT STAY CENTER AT :  5:15 AM               CALL THIS NUMBER IF ANY PROBLEMS THE DAY OF SURGERY :               [phone]                      REMEMBER:   Do not eat food or drink liquids AFTER MIDNIGHT    Take these medicines the morning of surgery with A SIP OF WATER:  DETROL   Do not wear jewelry, make-up   Do not wear lotions, powders, or perfumes.   Do not shave legs or underarms 12 hrs. before surgery (men may shave face)  Do not bring valuables to the hospital.  Contacts, dentures or bridgework may not be worn into surgery.  Leave suitcase in the car. After surgery it may be brought to your room.  For patients admitted to the hospital more than one night, checkout time is 11:00                          The day of discharge.   Patients discharged the day of surgery will not be allowed to drive home                             If going home same day of surgery, must have someone stay with you first                           24 hrs at home and arrange for some one to drive you home from hospital.    Special Instructions:   Please read over the following fact sheets that you were given:               1. MRSA  INFORMATION                      2. [place] PREPARING FOR SURGERY SHEET               3. INCENTIVE SPIROMETER                                                X_____________________________________________________________________        Failure to follow these instructions may result in cancellation of your surgery

## 2012-07-27 NOTE — Progress Notes (Signed)
UA faxed to Dr. Aluisio 

## 2012-07-28 ENCOUNTER — Inpatient Hospital Stay (HOSPITAL_COMMUNITY): Admission: RE | Admit: 2012-07-28 | Payer: BC Managed Care – PPO | Source: Ambulatory Visit

## 2012-07-28 LAB — URINE CULTURE

## 2012-07-30 ENCOUNTER — Other Ambulatory Visit: Payer: Self-pay | Admitting: Orthopedic Surgery

## 2012-07-30 NOTE — H&P (Signed)
Yolanda Henderson  DOB: 12/19/1960 Married / Language: English / Race: White Female  Date of Admission:  07/31/2012  Chief Complaint:  Right Knee Pain  History of Present Illness The patient is a 52 year old female who comes in for a preoperative History and Physical. The patient is scheduled for a right total knee arthroplasty to be performed by Dr. Frank V. Aluisio, MD at [facility] on 07/31/2012. The patient is a 52 year old female presenting for a post-operative visit. The patient comes in today 11 months out from right knee arthroscopy. The patient states that she is doing okay (She said that she does have "some level of pain with each step she takes". The pain is mainly on the anterior side of her knee. She has been taking Ibuprofen daily.) at this time. The cortisone shot only helped that night. She did not get much relief beyond that. The knee continues to cause pain and also popping. The patient had followup xrays today which have been compared to the previous fims taken back in Spetmeber of 2012. The xrays today showed that the arthritis has progressed from a mild narrowing of the medial compartment now to a bone on bone finding ont he new xrays. She feels like the knee is hurting her at all times. She states it is limiting what she can and can not do. She has pain day and night. She would like to be more active but can not do so because of the knee. She had cortisone without much benefit. Her scope did show significant medial OA. She is ready now to proceed with knee replacement surgery. They have been treated conservatively in the past for the above stated problem and despite conservative measures, they continue to have progressive pain and severe functional limitations and dysfunction. They have failed non-operative management including home exercise, medications, and injections. It is felt that they would benefit from undergoing total joint replacement. Risks and  benefits of the procedure have been discussed with the patient and they elect to proceed with surgery. There are no active contraindications to surgery such as ongoing infection or rapidly progressive neurological disease.   Problem List Primary osteoarthritis of both knees (715.16)   Allergies No Known Drug Allergies   Family History Cerebrovascular Accident. mother Congestive Heart Failure. mother, father and grandfather mothers side Diabetes Mellitus. grandfather fathers side Cancer. grandfather mothers side, grandmother fathers side and grandfather fathers side Osteoporosis. grandmother mothers side Hypertension. mother, grandfather mothers side and grandfather fathers side Drug / Alcohol Addiction. sister and brother Heart Disease. mother and grandfather mothers side Heart disease in female family member before age 65   Social History Illicit drug use. no Living situation. live with spouse Marital status. married Exercise. Exercises never Current work status. working full time Drug/Alcohol Rehab (Currently). no Copy of Drug/Alcohol Rehab (Previously). no Number of flights of stairs before winded. 1 Pain Contract. no Tobacco / smoke exposure. yes Tobacco use. never smoker Children. 1 Alcohol use. current drinker; drinks hard liquor; only occasionally per week   Medication History Ibuprofen ( Oral) Specific dose unknown - Active. Osteo Bi-Flex Regular Strength (250-200MG Tablet, Oral) Active. Multi Vitamin/Minerals ( Oral) Active. Detrol LA (2MG Capsule ER 24HR, Oral) Active.   Past Surgical History Arthroscopy of Knee. left Hysterectomy. partial (cancerous) Resection of Stomach   Medical History Urinary Frequency   Review of Systems General:Not Present- Chills, Fever, Night Sweats, Fatigue, Weight Gain, Weight Loss and Memory Loss. Skin:Not Present- Hives, Itching,   Rash, Eczema and Lesions. HEENT:Not Present- Tinnitus,  Headache, Double Vision, Visual Loss, Hearing Loss and Dentures. Respiratory:Not Present- Shortness of breath with exertion, Shortness of breath at rest, Allergies, Coughing up blood and Chronic Cough. Cardiovascular:Not Present- Chest Pain, Racing/skipping heartbeats, Difficulty Breathing Lying Down, Murmur, Swelling and Palpitations. Gastrointestinal:Not Present- Bloody Stool, Heartburn, Abdominal Pain, Vomiting, Nausea, Constipation, Diarrhea, Difficulty Swallowing, Jaundice and Loss of appetitie. Female Genitourinary:Not Present- Blood in Urine, Urinary frequency, Weak urinary stream, Discharge, Flank Pain, Incontinence, Painful Urination, Urgency, Urinary Retention and Urinating at Night. Musculoskeletal:Present- Joint Pain. Not Present- Muscle Weakness, Muscle Pain, Joint Swelling, Back Pain, Morning Stiffness and Spasms. Neurological:Not Present- Tremor, Dizziness, Blackout spells, Paralysis, Difficulty with balance and Weakness. Psychiatric:Not Present- Insomnia.   Vitals Pulse: 76 (Regular) Resp.: 14 (Unlabored) BP: 114/72 (Sitting, Right Arm, Standard)    Physical Exam The physical exam findings are as follows:   General Mental Status - Alert, cooperative and good historian. General Appearance- pleasant. Not in acute distress. Orientation- Oriented X3. Build & Nutrition- Well nourished and Well developed.   Head and Neck Head- normocephalic, atraumatic . Neck Global Assessment- supple. no bruit auscultated on the right and no bruit auscultated on the left.   Eye Pupil- Bilateral- Regular and Round. Motion- Bilateral- EOMI.   Chest and Lung Exam Auscultation: Breath sounds:- clear at anterior chest wall and - clear at posterior chest wall. Adventitious sounds:- No Adventitious sounds.   Cardiovascular Auscultation:Rhythm- Regular rate and rhythm. Heart Sounds- S1 WNL and S2 WNL. Murmurs & Other Heart Sounds:Auscultation of  the heart reveals - No Murmurs.   Abdomen Palpation/Percussion:Tenderness- Abdomen is non-tender to palpation. Rigidity (guarding)- Abdomen is soft. Auscultation:Auscultation of the abdomen reveals - Bowel sounds normal.   Female Genitourinary Not done, not pertinent to present illness  Musculoskeletal She is a well developed female. She is in no distress. Evaluation of her right knee shows no effusion. There is moderate crepitus on range of motion of the knee. Range is about 5-125. She is tender medial greater than lateral. There is no instability noted. Pulse, sensation and motor are intact. The left knee exam is normal. Bilateral hip exam is normal.  RADIOGRAPHS: AP of both knees and lateral. She has a significant medial and patellofemoral arthritis. She is bone on bone in her right knee. This is compared to a year ago when the X-rays were unremarkable. She does have arthritis in the left knee but now the right knee is far worse.  Assessment & Plan Primary osteoarthritis of both knees (715.16) Impression: Right Knee  Note: Plan is for a Right Total Knee Replacement by Dr. Aluisio.  Plan is to go home.  Drew Perkins, PA-C  

## 2012-07-30 NOTE — Anesthesia Preprocedure Evaluation (Addendum)
Anesthesia Evaluation  Patient identified by MRN, date of birth, ID band Patient awake    Reviewed: Allergy & Precautions, H&P , NPO status , Patient's Chart, lab work & pertinent test results  Airway Mallampati: II TM Distance: >3 FB Neck ROM: full    Dental no notable dental hx. (+) Teeth Intact and Dental Advisory Given   Pulmonary neg pulmonary ROS,  breath sounds clear to auscultation  Pulmonary exam normal       Cardiovascular Exercise Tolerance: Good negative cardio ROS  Rhythm:regular Rate:Normal     Neuro/Psych negative neurological ROS  negative psych ROS   GI/Hepatic negative GI ROS, Neg liver ROS,   Endo/Other  negative endocrine ROSMorbid obesity  Renal/GU negative Renal ROS  negative genitourinary   Musculoskeletal   Abdominal (+) + obese,   Peds  Hematology negative hematology ROS (+)   Anesthesia Other Findings   Reproductive/Obstetrics negative OB ROS                         Anesthesia Physical Anesthesia Plan  ASA: II  Anesthesia Plan: Spinal   Post-op Pain Management:    Induction:   Airway Management Planned: Simple Face Mask  Additional Equipment:   Intra-op Plan:   Post-operative Plan:   Informed Consent: I have reviewed the patients History and Physical, chart, labs and discussed the procedure including the risks, benefits and alternatives for the proposed anesthesia with the patient or authorized representative who has indicated his/her understanding and acceptance.   Dental Advisory Given  Plan Discussed with: CRNA and Surgeon  Anesthesia Plan Comments:        Anesthesia Quick Evaluation

## 2012-07-31 ENCOUNTER — Inpatient Hospital Stay (HOSPITAL_COMMUNITY)
Admission: RE | Admit: 2012-07-31 | Discharge: 2012-08-02 | DRG: 209 | Disposition: A | Discharge status: Home Health | Payer: BC Managed Care – PPO | Source: Ambulatory Visit | Attending: Orthopedic Surgery | Admitting: Orthopedic Surgery

## 2012-07-31 ENCOUNTER — Encounter (HOSPITAL_COMMUNITY): Payer: Self-pay | Admitting: Anesthesiology

## 2012-07-31 ENCOUNTER — Encounter (HOSPITAL_COMMUNITY): Payer: Self-pay | Admitting: *Deleted

## 2012-07-31 ENCOUNTER — Encounter (HOSPITAL_COMMUNITY)
Admission: RE | Disposition: A | Discharge status: Home Health | Payer: Self-pay | Source: Ambulatory Visit | Attending: Orthopedic Surgery

## 2012-07-31 ENCOUNTER — Ambulatory Visit (HOSPITAL_COMMUNITY): Payer: BC Managed Care – PPO | Admitting: Anesthesiology

## 2012-07-31 DIAGNOSIS — M179 Osteoarthritis of knee, unspecified: Secondary | ICD-10-CM

## 2012-07-31 DIAGNOSIS — N318 Other neuromuscular dysfunction of bladder: Secondary | ICD-10-CM | POA: Diagnosis present

## 2012-07-31 DIAGNOSIS — D62 Acute posthemorrhagic anemia: Secondary | ICD-10-CM

## 2012-07-31 DIAGNOSIS — M1710 Unilateral primary osteoarthritis, unspecified knee: Principal | ICD-10-CM | POA: Diagnosis present

## 2012-07-31 DIAGNOSIS — F17200 Nicotine dependence, unspecified, uncomplicated: Secondary | ICD-10-CM | POA: Diagnosis present

## 2012-07-31 DIAGNOSIS — Z79899 Other long term (current) drug therapy: Secondary | ICD-10-CM

## 2012-07-31 DIAGNOSIS — Z6841 Body Mass Index (BMI) 40.0 and over, adult: Secondary | ICD-10-CM

## 2012-07-31 DIAGNOSIS — K219 Gastro-esophageal reflux disease without esophagitis: Secondary | ICD-10-CM | POA: Diagnosis present

## 2012-07-31 HISTORY — PX: TOTAL KNEE ARTHROPLASTY: SHX125

## 2012-07-31 LAB — TYPE AND SCREEN
ABO/RH(D): O POS
Antibody Screen: NEGATIVE

## 2012-07-31 SURGERY — ARTHROPLASTY, KNEE, TOTAL
Anesthesia: Spinal | Site: Knee | Laterality: Right | Wound class: Clean

## 2012-07-31 MED ORDER — ACETAMINOPHEN 10 MG/ML IV SOLN
1000.0000 mg | Freq: Once | INTRAVENOUS | Status: AC
  Administered 2012-07-31: 1000 mg via INTRAVENOUS

## 2012-07-31 MED ORDER — PROPOFOL INFUSION 10 MG/ML OPTIME
INTRAVENOUS | Status: DC | PRN
  Administered 2012-07-31: 75 ug/kg/min via INTRAVENOUS

## 2012-07-31 MED ORDER — SODIUM CHLORIDE 0.9 % IJ SOLN
INTRAMUSCULAR | Status: DC | PRN
  Administered 2012-07-31: 09:00:00

## 2012-07-31 MED ORDER — CEFAZOLIN SODIUM-DEXTROSE 2-3 GM-% IV SOLR
2.0000 g | INTRAVENOUS | Status: AC
  Administered 2012-07-31: 2 g via INTRAVENOUS

## 2012-07-31 MED ORDER — METOCLOPRAMIDE HCL 10 MG PO TABS: 5.0000 mg | ORAL_TABLET | Freq: Three times a day (TID) | ORAL | Status: DC | PRN

## 2012-07-31 MED ORDER — LACTATED RINGERS IV SOLN: INTRAVENOUS | Status: DC

## 2012-07-31 MED ORDER — CEFAZOLIN SODIUM-DEXTROSE 2-3 GM-% IV SOLR
INTRAVENOUS | Status: AC
  Filled 2012-07-31: qty 50

## 2012-07-31 MED ORDER — EPHEDRINE SULFATE 50 MG/ML IJ SOLN
INTRAMUSCULAR | Status: DC | PRN
  Administered 2012-07-31 (×4): 5 mg via INTRAVENOUS

## 2012-07-31 MED ORDER — TRAMADOL HCL 50 MG PO TABS: 50.0000 mg | ORAL_TABLET | Freq: Four times a day (QID) | ORAL | Status: DC | PRN

## 2012-07-31 MED ORDER — ACETAMINOPHEN 325 MG PO TABS: 650.0000 mg | ORAL_TABLET | Freq: Four times a day (QID) | ORAL | Status: DC | PRN

## 2012-07-31 MED ORDER — OXYCODONE HCL 5 MG PO TABS
5.0000 mg | ORAL_TABLET | ORAL | Status: DC | PRN
  Administered 2012-07-31: 5 mg via ORAL
  Administered 2012-07-31 – 2012-08-02 (×10): 10 mg via ORAL
  Filled 2012-07-31 (×9): qty 2
  Filled 2012-07-31: qty 1
  Filled 2012-07-31: qty 2

## 2012-07-31 MED ORDER — RIVAROXABAN 10 MG PO TABS
10.0000 mg | ORAL_TABLET | Freq: Every day | ORAL | Status: DC
  Administered 2012-08-01 – 2012-08-02 (×2): 10 mg via ORAL
  Filled 2012-07-31 (×3): qty 1

## 2012-07-31 MED ORDER — CHLORHEXIDINE GLUCONATE 4 % EX LIQD
60.0000 mL | Freq: Once | CUTANEOUS | Status: DC
  Filled 2012-07-31: qty 60

## 2012-07-31 MED ORDER — ONDANSETRON HCL 4 MG PO TABS: 4.0000 mg | ORAL_TABLET | Freq: Four times a day (QID) | ORAL | Status: DC | PRN

## 2012-07-31 MED ORDER — DEXAMETHASONE 6 MG PO TABS
10.0000 mg | ORAL_TABLET | Freq: Once | ORAL | Status: AC
  Administered 2012-08-01: 10 mg via ORAL
  Filled 2012-07-31: qty 1

## 2012-07-31 MED ORDER — SODIUM CHLORIDE 0.9 % IV SOLN: INTRAVENOUS | Status: DC

## 2012-07-31 MED ORDER — POLYETHYLENE GLYCOL 3350 17 G PO PACK: 17.0000 g | PACK | Freq: Every day | ORAL | Status: DC | PRN

## 2012-07-31 MED ORDER — BISACODYL 10 MG RE SUPP: 10.0000 mg | Freq: Every day | RECTAL | Status: DC | PRN

## 2012-07-31 MED ORDER — ONDANSETRON HCL 4 MG/2ML IJ SOLN
INTRAMUSCULAR | Status: DC | PRN
  Administered 2012-07-31: 4 mg via INTRAVENOUS

## 2012-07-31 MED ORDER — HYDROMORPHONE HCL PF 1 MG/ML IJ SOLN: 0.2500 mg | INTRAMUSCULAR | Status: DC | PRN

## 2012-07-31 MED ORDER — PHENOL 1.4 % MT LIQD
1.0000 | OROMUCOSAL | Status: DC | PRN
  Filled 2012-07-31: qty 177

## 2012-07-31 MED ORDER — DIPHENHYDRAMINE HCL 12.5 MG/5ML PO ELIX: 12.5000 mg | ORAL_SOLUTION | ORAL | Status: DC | PRN

## 2012-07-31 MED ORDER — FESOTERODINE FUMARATE ER 8 MG PO TB24
8.0000 mg | ORAL_TABLET | Freq: Every day | ORAL | Status: DC
  Administered 2012-08-01 – 2012-08-02 (×2): 8 mg via ORAL
  Filled 2012-07-31 (×2): qty 1

## 2012-07-31 MED ORDER — PHENYLEPHRINE HCL 10 MG/ML IJ SOLN
INTRAMUSCULAR | Status: DC | PRN
  Administered 2012-07-31 (×2): 40 ug via INTRAVENOUS

## 2012-07-31 MED ORDER — DOCUSATE SODIUM 100 MG PO CAPS
100.0000 mg | ORAL_CAPSULE | Freq: Two times a day (BID) | ORAL | Status: DC
  Administered 2012-07-31 – 2012-08-02 (×3): 100 mg via ORAL

## 2012-07-31 MED ORDER — ACETAMINOPHEN 10 MG/ML IV SOLN
1000.0000 mg | Freq: Four times a day (QID) | INTRAVENOUS | Status: AC
  Administered 2012-07-31 – 2012-08-01 (×4): 1000 mg via INTRAVENOUS
  Filled 2012-07-31 (×7): qty 100

## 2012-07-31 MED ORDER — MIDAZOLAM HCL 5 MG/5ML IJ SOLN
INTRAMUSCULAR | Status: DC | PRN
  Administered 2012-07-31: 2 mg via INTRAVENOUS

## 2012-07-31 MED ORDER — SODIUM CHLORIDE 0.9 % IR SOLN
Status: DC | PRN
  Administered 2012-07-31: 1000 mL

## 2012-07-31 MED ORDER — FENTANYL CITRATE 0.05 MG/ML IJ SOLN
INTRAMUSCULAR | Status: DC | PRN
  Administered 2012-07-31: 50 ug via INTRAVENOUS
  Administered 2012-07-31 (×2): 25 ug via INTRAVENOUS

## 2012-07-31 MED ORDER — DEXAMETHASONE SODIUM PHOSPHATE 10 MG/ML IJ SOLN: 10.0000 mg | Freq: Once | INTRAMUSCULAR | Status: AC

## 2012-07-31 MED ORDER — MORPHINE SULFATE 2 MG/ML IJ SOLN: 1.0000 mg | INTRAMUSCULAR | Status: DC | PRN

## 2012-07-31 MED ORDER — BUPIVACAINE LIPOSOME 1.3 % IJ SUSP
20.0000 mL | Freq: Once | INTRAMUSCULAR | Status: DC
  Filled 2012-07-31: qty 20

## 2012-07-31 MED ORDER — DEXTROSE-NACL 5-0.9 % IV SOLN
INTRAVENOUS | Status: DC
  Administered 2012-07-31: 20:00:00 via INTRAVENOUS
  Administered 2012-07-31: 75 mL/h via INTRAVENOUS

## 2012-07-31 MED ORDER — 0.9 % SODIUM CHLORIDE (POUR BTL) OPTIME
TOPICAL | Status: DC | PRN
  Administered 2012-07-31: 1000 mL

## 2012-07-31 MED ORDER — MENTHOL 3 MG MT LOZG
1.0000 | LOZENGE | OROMUCOSAL | Status: DC | PRN
  Filled 2012-07-31: qty 9

## 2012-07-31 MED ORDER — ACETAMINOPHEN 10 MG/ML IV SOLN
INTRAVENOUS | Status: AC
  Filled 2012-07-31: qty 100

## 2012-07-31 MED ORDER — ONDANSETRON HCL 4 MG/2ML IJ SOLN
4.0000 mg | Freq: Four times a day (QID) | INTRAMUSCULAR | Status: DC | PRN
  Administered 2012-08-01: 4 mg via INTRAVENOUS
  Filled 2012-07-31: qty 2

## 2012-07-31 MED ORDER — FLEET ENEMA 7-19 GM/118ML RE ENEM: 1.0000 | ENEMA | Freq: Once | RECTAL | Status: AC | PRN

## 2012-07-31 MED ORDER — METHOCARBAMOL 100 MG/ML IJ SOLN: 500.0000 mg | Freq: Four times a day (QID) | INTRAVENOUS | Status: DC | PRN

## 2012-07-31 MED ORDER — CEFAZOLIN SODIUM-DEXTROSE 2-3 GM-% IV SOLR
2.0000 g | Freq: Four times a day (QID) | INTRAVENOUS | Status: AC
  Administered 2012-07-31 (×2): 2 g via INTRAVENOUS
  Filled 2012-07-31 (×2): qty 50

## 2012-07-31 MED ORDER — ACETAMINOPHEN 650 MG RE SUPP: 650.0000 mg | Freq: Four times a day (QID) | RECTAL | Status: DC | PRN

## 2012-07-31 MED ORDER — METOCLOPRAMIDE HCL 5 MG/ML IJ SOLN: 5.0000 mg | Freq: Three times a day (TID) | INTRAMUSCULAR | Status: DC | PRN

## 2012-07-31 MED ORDER — BUPIVACAINE IN DEXTROSE 0.75-8.25 % IT SOLN
INTRATHECAL | Status: DC | PRN
  Administered 2012-07-31: 2 mL via INTRATHECAL

## 2012-07-31 MED ORDER — METHOCARBAMOL 500 MG PO TABS
500.0000 mg | ORAL_TABLET | Freq: Four times a day (QID) | ORAL | Status: DC | PRN
  Administered 2012-07-31 – 2012-08-02 (×6): 500 mg via ORAL
  Filled 2012-07-31 (×6): qty 1

## 2012-07-31 MED ORDER — LACTATED RINGERS IV SOLN
INTRAVENOUS | Status: DC | PRN
  Administered 2012-07-31: 08:00:00 via INTRAVENOUS

## 2012-07-31 SURGICAL SUPPLY — 54 items
BAG ZIPLOCK 12X15 (MISCELLANEOUS) ×2 IMPLANT
BANDAGE ELASTIC 4 VELCRO ST LF (GAUZE/BANDAGES/DRESSINGS) ×2 IMPLANT
BANDAGE ELASTIC 6 VELCRO ST LF (GAUZE/BANDAGES/DRESSINGS) ×2 IMPLANT
BANDAGE ESMARK 6X9 LF (GAUZE/BANDAGES/DRESSINGS) ×1 IMPLANT
BLADE SAG 18X100X1.27 (BLADE) ×2 IMPLANT
BLADE SAW SGTL 11.0X1.19X90.0M (BLADE) ×2 IMPLANT
BNDG ESMARK 6X9 LF (GAUZE/BANDAGES/DRESSINGS) ×2
BOWL SMART MIX CTS (DISPOSABLE) ×2 IMPLANT
CEMENT HV SMART SET (Cement) ×4 IMPLANT
CLOTH BEACON ORANGE TIMEOUT ST (SAFETY) ×2 IMPLANT
CUFF TOURN SGL QUICK 34 (TOURNIQUET CUFF) ×1
CUFF TRNQT CYL 34X4X40X1 (TOURNIQUET CUFF) ×1 IMPLANT
DRAPE EXTREMITY T 121X128X90 (DRAPE) ×2 IMPLANT
DRAPE POUCH INSTRU U-SHP 10X18 (DRAPES) ×2 IMPLANT
DRAPE U-SHAPE 47X51 STRL (DRAPES) ×2 IMPLANT
DRSG ADAPTIC 3X8 NADH LF (GAUZE/BANDAGES/DRESSINGS) ×2 IMPLANT
DRSG PAD ABDOMINAL 8X10 ST (GAUZE/BANDAGES/DRESSINGS) ×2 IMPLANT
DURAPREP 26ML APPLICATOR (WOUND CARE) ×2 IMPLANT
ELECT REM PT RETURN 9FT ADLT (ELECTROSURGICAL) ×2
ELECTRODE REM PT RTRN 9FT ADLT (ELECTROSURGICAL) ×1 IMPLANT
EVACUATOR 1/8 PVC DRAIN (DRAIN) ×2 IMPLANT
FACESHIELD LNG OPTICON STERILE (SAFETY) ×10 IMPLANT
GLOVE BIO SURGEON STRL SZ7.5 (GLOVE) ×2 IMPLANT
GLOVE BIO SURGEON STRL SZ8 (GLOVE) ×2 IMPLANT
GLOVE BIOGEL PI IND STRL 8 (GLOVE) ×2 IMPLANT
GLOVE BIOGEL PI INDICATOR 8 (GLOVE) ×2
GLOVE SURG SS PI 6.5 STRL IVOR (GLOVE) ×2 IMPLANT
GOWN STRL NON-REIN LRG LVL3 (GOWN DISPOSABLE) ×4 IMPLANT
GOWN STRL REIN XL XLG (GOWN DISPOSABLE) ×2 IMPLANT
HANDPIECE INTERPULSE COAX TIP (DISPOSABLE) ×1
IMMOBILIZER KNEE 20 (SOFTGOODS) ×2
IMMOBILIZER KNEE 20 THIGH 36 (SOFTGOODS) ×1 IMPLANT
KIT BASIN OR (CUSTOM PROCEDURE TRAY) ×2 IMPLANT
MANIFOLD NEPTUNE II (INSTRUMENTS) ×2 IMPLANT
NDL SAFETY ECLIPSE 18X1.5 (NEEDLE) ×1 IMPLANT
NEEDLE HYPO 18GX1.5 SHARP (NEEDLE) ×1
NS IRRIG 1000ML POUR BTL (IV SOLUTION) ×2 IMPLANT
PACK TOTAL JOINT (CUSTOM PROCEDURE TRAY) ×2 IMPLANT
PAD ABD 7.5X8 STRL (GAUZE/BANDAGES/DRESSINGS) ×2 IMPLANT
PADDING CAST COTTON 6X4 STRL (CAST SUPPLIES) ×2 IMPLANT
POSITIONER SURGICAL ARM (MISCELLANEOUS) ×2 IMPLANT
SET HNDPC FAN SPRY TIP SCT (DISPOSABLE) ×1 IMPLANT
SPONGE GAUZE 4X4 12PLY (GAUZE/BANDAGES/DRESSINGS) ×2 IMPLANT
STRIP CLOSURE SKIN 1/2X4 (GAUZE/BANDAGES/DRESSINGS) ×4 IMPLANT
SUCTION FRAZIER 12FR DISP (SUCTIONS) ×2 IMPLANT
SUT MNCRL AB 4-0 PS2 18 (SUTURE) ×2 IMPLANT
SUT VIC AB 2-0 CT1 27 (SUTURE) ×3
SUT VIC AB 2-0 CT1 TAPERPNT 27 (SUTURE) ×3 IMPLANT
SUT VLOC 180 0 24IN GS25 (SUTURE) ×2 IMPLANT
SYR 50ML LL SCALE MARK (SYRINGE) ×2 IMPLANT
TOWEL OR 17X26 10 PK STRL BLUE (TOWEL DISPOSABLE) ×4 IMPLANT
TRAY FOLEY CATH 14FRSI W/METER (CATHETERS) ×2 IMPLANT
WATER STERILE IRR 1500ML POUR (IV SOLUTION) ×2 IMPLANT
WRAP KNEE MAXI GEL POST OP (GAUZE/BANDAGES/DRESSINGS) ×4 IMPLANT

## 2012-07-31 NOTE — Care Management Note (Addendum)
    Page 1 of 2   08/02/2012     2:06:52 PM   CARE MANAGEMENT NOTE 08/02/2012  Patient:  Yolanda Henderson, Yolanda Henderson   Account Number:  [id number]  Date Initiated:  07/31/2012  Documentation initiated by:  Colleen Can  Subjective/Objective Assessment:   dx rt total knee replacemnt on day of admision 07/31/2012     Action/Plan:   home with [facility] services; pt pending /CM will f/u   Anticipated DC Date:  08/03/2012   Anticipated DC Plan:  HOME W HOME HEALTH SERVICES      DC Planning Services  CM consult      [facility] Choice  HOME HEALTH   Choice offered to / List presented to:  C-1 Patient        HH arranged  HH-2 PT      [facility] agency  [facility]   Status of service:  Completed, signed off Medicare Important Message given?   (If response is "NO", the following Medicare IM given date fields will be blank) Date Medicare IM given:   Date Additional Medicare IM given:    Discharge Disposition:    Per UR Regulation:  Reviewed for med. necessity/level of care/duration of stay  If discussed at Long Length of Stay Meetings, dates discussed:    Comments:  08/02/2012 Colleen Can BSN RN CCM [phone] Pt for discharge today. Genevieve Norlander will provide [facility] services with start date 08/03/2012.   08/01/2012 Colleen Can BSN RN CCM [phone] CM SPOKE WITH PATIENT. Plans are for patient to return to her home in [place] where mother and daughter will be her caregivers. She already has RW and plans to use [place] for [facility] services upon discharge.

## 2012-07-31 NOTE — Op Note (Signed)
Pre-operative diagnosis- Osteoarthritis  Right knee(s)  Post-operative diagnosis- Osteoarthritis Right knee(s)  Procedure-  Right  Total Knee Arthroplasty  Surgeon- Gus Rankin. Aluisio, MD  Assistant- Avel Peace, PA-C   Anesthesia-  Spinal EBL-* No blood loss amount entered *  Drains Hemovac  Tourniquet time-  Total Tourniquet Time Documented: Thigh (Right) - 37 minutes Total: Thigh (Right) - 37 minutes    Complications- None  Condition-PACU - hemodynamically stable.   Brief Clinical Note  Yolanda Henderson is a 52 y.o. year old female with end stage OA of her right knee with progressively worsening pain and dysfunction. She has constant pain, with activity and at rest and significant functional deficits with difficulties even with ADLs. She has had extensive non-op management including analgesics, injections of cortisone and viscosupplements, and home exercise program, but remains in significant pain with significant dysfunction.Radiographs show bone on bone arthritis medial and patellofemoral with varus deformity. She presents now for right Total Knee Arthroplasty.    Procedure in detail---   The patient is brought into the operating room and positioned supine on the operating table. After successful administration of  Spinal,   a tourniquet is placed high on the  Right thigh(s) and the lower extremity is prepped and draped in the usual sterile fashion. Time out is performed by the operating team and then the  Right lower extremity is wrapped in Esmarch, knee flexed and the tourniquet inflated to 300 mmHg.       A midline incision is made with a ten blade through the subcutaneous tissue to the level of the extensor mechanism. A fresh blade is used to make a medial parapatellar arthrotomy. Soft tissue over the proximal medial tibia is subperiosteally elevated to the joint line with a knife and into the semimembranosus bursa with a Cobb elevator. Soft tissue over the proximal lateral  tibia is elevated with attention being paid to avoiding the patellar tendon on the tibial tubercle. The patella is everted, knee flexed 90 degrees and the ACL and PCL are removed. Findings are bone on bone medial and patellofemoral with large medial osteolphytes.        The drill is used to create a starting hole in the distal femur and the canal is thoroughly irrigated with sterile saline to remove the fatty contents. The 5 degree Right  valgus alignment guide is placed into the femoral canal and the distal femoral cutting block is pinned to remove 10 mm off the distal femur. Resection is made with an oscillating saw.      The tibia is subluxed forward and the menisci are removed. The extramedullary alignment guide is placed referencing proximally at the medial aspect of the tibial tubercle and distally along the second metatarsal axis and tibial crest. The block is pinned to remove 2mm off the more deficient medial  side. Resection is made with an oscillating saw. Size 2.5is the most appropriate size for the tibia and the proximal tibia is prepared with the modular drill and keel punch for that size.      The femoral sizing guide is placed and size 2.5 is most appropriate. Rotation is marked off the epicondylar axis and confirmed by creating a rectangular flexion gap at 90 degrees. The size 2.5 cutting block is pinned in this rotation and the anterior, posterior and chamfer cuts are made with the oscillating saw. The intercondylar block is then placed and that cut is made.      Trial size 2.5 tibial component, trial  size 2.5 posterior stabilized femur and a 12.5  mm posterior stabilized rotating platform insert trial is placed. Full extension is achieved with excellent varus/valgus and anterior/posterior balance throughout full range of motion. The patella is everted and thickness measured to be 22  mm. Free hand resection is taken to 12 mm, a 35 template is placed, lug holes are drilled, trial patella is  placed, and it tracks normally. Osteophytes are removed off the posterior femur with the trial in place. All trials are removed and the cut bone surfaces prepared with pulsatile lavage. Cement is mixed and once ready for implantation, the size 2.5 tibial implant, size  2.5 posterior stabilized femoral component, and the size 35 patella are cemented in place and the patella is held with the clamp. The trial insert is placed and the knee held in full extension. The Exparel (20 ml mixed with 50 ml saline) is injected into the extensor mechanism, posterior capsule, medial and lateral gutters and subcutaneous tissues.  All extruded cement is removed and once the cement is hard the permanent 12.5 mm posterior stabilized rotating platform insert is placed into the tibial tray.      The wound is copiously irrigated with saline solution and the extensor mechanism closed over a hemovac drain with #1 PDS suture. The tourniquet is released for a total tourniquet time of 37  minutes. Flexion against gravity is 135 degrees and the patella tracks normally. Subcutaneous tissue is closed with 2.0 vicryl and subcuticular with running 4.0 Monocryl. The incision is cleaned and dried and steri-strips and a bulky sterile dressing are applied. The limb is placed into a knee immobilizer and the patient is awakened and transported to recovery in stable condition.      Please note that a surgical assistant was a medical necessity for this procedure in order to perform it in a safe and expeditious manner. Surgical assistant was necessary to retract the ligaments and vital neurovascular structures to prevent injury to them and also necessary for proper positioning of the limb to allow for anatomic placement of the prosthesis.   Gus Rankin Aluisio, MD    07/31/2012, 9:14 AM

## 2012-07-31 NOTE — H&P (View-Only) (Signed)
Yolanda Henderson  DOB: 17-Mar-1961 Married / Language: English / Race: White Female  Date of Admission:  07/31/2012  Chief Complaint:  Right Knee Pain  History of Present Illness The patient is a 52 year old female who comes in for a preoperative History and Physical. The patient is scheduled for a right total knee arthroplasty to be performed by Dr. Gus Rankin. Aluisio, MD at [facility] on 07/31/2012. The patient is a 52 year old female presenting for a post-operative visit. The patient comes in today 11 months out from right knee arthroscopy. The patient states that she is doing okay (She said that she does have "some level of pain with each step she takes". The pain is mainly on the anterior side of her knee. She has been taking Ibuprofen daily.) at this time. The cortisone shot only helped that night. She did not get much relief beyond that. The knee continues to cause pain and also popping. The patient had followup xrays today which have been compared to the previous fims taken back in [place] of 2012. The xrays today showed that the arthritis has progressed from a mild narrowing of the medial compartment now to a bone on bone finding ont he new xrays. She feels like the knee is hurting her at all times. She states it is limiting what she can and can not do. She has pain day and night. She would like to be more active but can not do so because of the knee. She had cortisone without much benefit. Her scope did show significant medial OA. She is ready now to proceed with knee replacement surgery. They have been treated conservatively in the past for the above stated problem and despite conservative measures, they continue to have progressive pain and severe functional limitations and dysfunction. They have failed non-operative management including home exercise, medications, and injections. It is felt that they would benefit from undergoing total joint replacement. Risks and  benefits of the procedure have been discussed with the patient and they elect to proceed with surgery. There are no active contraindications to surgery such as ongoing infection or rapidly progressive neurological disease.   Problem List Primary osteoarthritis of both knees (715.16)   Allergies No Known Drug Allergies   Family History Cerebrovascular Accident. mother Congestive Heart Failure. mother, father and grandfather mothers side Diabetes Mellitus. grandfather fathers side Cancer. grandfather mothers side, grandmother fathers side and grandfather fathers side Osteoporosis. grandmother mothers side Hypertension. mother, grandfather mothers side and grandfather fathers side Drug / Alcohol Addiction. sister and brother Heart Disease. mother and grandfather mothers side Heart disease in female family member before age 37   Social History Illicit drug use. no Living situation. live with spouse Marital status. married Exercise. Exercises never Current work status. working full time Drug/Alcohol Rehab (Currently). no Copy of Drug/Alcohol Rehab (Previously). no Number of flights of stairs before winded. 1 Pain Contract. no Tobacco / smoke exposure. yes Tobacco use. never smoker Children. 1 Alcohol use. current drinker; drinks hard liquor; only occasionally per week   Medication History Ibuprofen ( Oral) Specific dose unknown - Active. Osteo Bi-Flex Regular Strength (250-200MG  Tablet, Oral) Active. Multi Vitamin/Minerals ( Oral) Active. Detrol LA (2MG  Capsule ER 24HR, Oral) Active.   Past Surgical History Arthroscopy of Knee. left Hysterectomy. partial (cancerous) Resection of Stomach   Medical History Urinary Frequency   Review of Systems General:Not Present- Chills, Fever, Night Sweats, Fatigue, Weight Gain, Weight Loss and Memory Loss. Skin:Not Present- Hives, Itching,  Rash, Eczema and Lesions. HEENT:Not Present- Tinnitus,  Headache, Double Vision, Visual Loss, Hearing Loss and Dentures. Respiratory:Not Present- Shortness of breath with exertion, Shortness of breath at rest, Allergies, Coughing up blood and Chronic Cough. Cardiovascular:Not Present- Chest Pain, Racing/skipping heartbeats, Difficulty Breathing Lying Down, Murmur, Swelling and Palpitations. Gastrointestinal:Not Present- Bloody Stool, Heartburn, Abdominal Pain, Vomiting, Nausea, Constipation, Diarrhea, Difficulty Swallowing, Jaundice and Loss of appetitie. Female Genitourinary:Not Present- Blood in Urine, Urinary frequency, Weak urinary stream, Discharge, Flank Pain, Incontinence, Painful Urination, Urgency, Urinary Retention and Urinating at Night. Musculoskeletal:Present- Joint Pain. Not Present- Muscle Weakness, Muscle Pain, Joint Swelling, Back Pain, Morning Stiffness and Spasms. Neurological:Not Present- Tremor, Dizziness, Blackout spells, Paralysis, Difficulty with balance and Weakness. Psychiatric:Not Present- Insomnia.   Vitals Pulse: 76 (Regular) Resp.: 14 (Unlabored) BP: 114/72 (Sitting, Right Arm, Standard)    Physical Exam The physical exam findings are as follows:   General Mental Status - Alert, cooperative and good historian. General Appearance- pleasant. Not in acute distress. Orientation- Oriented X3. Build & Nutrition- Well nourished and Well developed.   Head and Neck Head- normocephalic, atraumatic . Neck Global Assessment- supple. no bruit auscultated on the right and no bruit auscultated on the left.   Eye Pupil- Bilateral- Regular and Round. Motion- Bilateral- EOMI.   Chest and Lung Exam Auscultation: Breath sounds:- clear at anterior chest wall and - clear at posterior chest wall. Adventitious sounds:- No Adventitious sounds.   Cardiovascular Auscultation:Rhythm- Regular rate and rhythm. Heart Sounds- S1 WNL and S2 WNL. Murmurs & Other Heart Sounds:Auscultation of  the heart reveals - No Murmurs.   Abdomen Palpation/Percussion:Tenderness- Abdomen is non-tender to palpation. Rigidity (guarding)- Abdomen is soft. Auscultation:Auscultation of the abdomen reveals - Bowel sounds normal.   Female Genitourinary Not done, not pertinent to present illness  Musculoskeletal She is a well developed female. She is in no distress. Evaluation of her right knee shows no effusion. There is moderate crepitus on range of motion of the knee. Range is about 5-125. She is tender medial greater than lateral. There is no instability noted. Pulse, sensation and motor are intact. The left knee exam is normal. Bilateral hip exam is normal.  RADIOGRAPHS: AP of both knees and lateral. She has a significant medial and patellofemoral arthritis. She is bone on bone in her right knee. This is compared to a year ago when the X-rays were unremarkable. She does have arthritis in the left knee but now the right knee is far worse.  Assessment & Plan Primary osteoarthritis of both knees (715.16) Impression: Right Knee  Note: Plan is for a Right Total Knee Replacement by Dr. Lequita Halt.  Plan is to go home.  Avel Peace, PA-C

## 2012-07-31 NOTE — Transfer of Care (Signed)
Immediate Anesthesia Transfer of Care Note  Patient: Yolanda Henderson  Procedure(s) Performed: Procedure(s): TOTAL KNEE ARTHROPLASTY (Right)  Patient Location: PACU  Anesthesia Type:Spinal  Level of Consciousness: awake, alert , oriented and patient cooperative  Airway & Oxygen Therapy: Patient Spontanous Breathing and Patient connected to face mask oxygen  Post-op Assessment: Report given to PACU RN and Post -op Vital signs reviewed and stable  Post vital signs: Reviewed and stable  Complications: No apparent anesthesia complications

## 2012-07-31 NOTE — Anesthesia Postprocedure Evaluation (Signed)
  Anesthesia Post-op Note  Patient: Yolanda "Joyce Gross" Nigel Sloop  Procedure(s) Performed: Procedure(s) (LRB): TOTAL KNEE ARTHROPLASTY (Right)  Patient Location: PACU  Anesthesia Type: Spinal  Level of Consciousness: awake and alert   Airway and Oxygen Therapy: Patient Spontanous Breathing  Post-op Pain: mild  Post-op Assessment: Post-op Vital signs reviewed, Patient's Cardiovascular Status Stable, Respiratory Function Stable, Patent Airway and No signs of Nausea or vomiting  Last Vitals:  Filed Vitals:   07/31/12 1100  BP: 112/83  Pulse: 65  Temp: 36.5 C  Resp: 16    Post-op Vital Signs: stable   Complications: No apparent anesthesia complications

## 2012-07-31 NOTE — Interval H&P Note (Signed)
History and Physical Interval Note:  07/31/2012 6:53 AM  Yolanda Henderson  has presented today for surgery, with the diagnosis of Osteoarthritis of the Right Knee  The various methods of treatment have been discussed with the patient and family. After consideration of risks, benefits and other options for treatment, the patient has consented to  Procedure(s): TOTAL KNEE ARTHROPLASTY (Right) as a surgical intervention .  The patient's history has been reviewed, patient examined, no change in status, stable for surgery.  I have reviewed the patient's chart and labs.  Questions were answered to the patient's satisfaction.     Loanne Drilling

## 2012-07-31 NOTE — Anesthesia Procedure Notes (Signed)
Spinal  Patient location during procedure: OR Start time: 07/31/2012 8:10 AM End time: 07/31/2012 8:16 AM Staffing Anesthesiologist: Ronelle Nigh L CRNA/Resident: Early Osmond E Performed by: resident/CRNA  Preanesthetic Checklist Completed: patient identified, site marked, surgical consent, pre-op evaluation, timeout performed, IV checked, risks and benefits discussed and monitors and equipment checked Spinal Block Patient position: sitting Prep: Betadine Patient monitoring: continuous pulse ox and blood pressure Approach: midline Location: L3-4 Injection technique: single-shot Needle Needle type: Sprotte  Needle gauge: 25 G Assessment Sensory level: T6

## 2012-08-01 ENCOUNTER — Encounter (HOSPITAL_COMMUNITY): Payer: Self-pay | Admitting: Orthopedic Surgery

## 2012-08-01 LAB — CBC
MCH: 30.1 pg (ref 26.0–34.0)
MCV: 91.8 fL (ref 78.0–100.0)
Platelets: 257 10*3/uL (ref 150–400)
RDW: 12.3 % (ref 11.5–15.5)
WBC: 12.5 10*3/uL — ABNORMAL HIGH (ref 4.0–10.5)

## 2012-08-01 LAB — BASIC METABOLIC PANEL
Calcium: 7.8 mg/dL — ABNORMAL LOW (ref 8.4–10.5)
Creatinine, Ser: 0.78 mg/dL (ref 0.50–1.10)
GFR calc Af Amer: >90 mL/min (ref >90)

## 2012-08-01 NOTE — Progress Notes (Signed)
   Subjective: 1 Day Post-Op Procedure(s) (LRB): TOTAL KNEE ARTHROPLASTY (Right) Patient reports pain as mild and moderate.  Little better this morning. Patient seen in rounds with Dr. Lequita Halt. Patient is well, and has had no acute complaints or problems We will start therapy today.  Plan is to go Home after hospital stay.  Objective: Vital signs in last 24 hours: Temp:  [97.7 F (36.5 C)-98.5 F (36.9 C)] 98.3 F (36.8 C) (03/11 0554) Pulse Rate:  [65-95] 84 (03/11 0554) Resp:  [14-18] 16 (03/11 0554) BP: (99-122)/(59-83) 114/80 mmHg (03/11 0554) SpO2:  [96 %-100 %] 99 % (03/11 0554) Weight:  [101.606 kg (224 lb)] 101.606 kg (224 lb) (03/10 1115)  Intake/Output from previous day:  Intake/Output Summary (Last 24 hours) at 08/01/12 0822 Last data filed at 08/01/12 0554  Gross per 24 hour  Intake   2740 ml  Output   2520 ml  Net    220 ml    Intake/Output this shift: UOP 650 since MN +220  Labs:  Recent Labs  08/01/12 0452  HGB 11.7*    Recent Labs  08/01/12 0452  WBC 12.5*  RBC 3.89  HCT 35.7*  PLT 257    Recent Labs  08/01/12 0452  NA 138  K 3.8  CL 104  CO2 27  BUN 10  CREATININE 0.78  GLUCOSE 116*  CALCIUM 7.8*   No results found for this basename: LABPT, INR,  in the last 72 hours  EXAM General - Patient is Alert, Appropriate and Oriented Extremity - Neurovascular intact Sensation intact distally Dorsiflexion/Plantar flexion intact Dressing - dressing C/D/I Motor Function - intact, moving foot and toes well on exam.  Hemovac pulled without difficulty.  Past Medical History  Diagnosis Date  . History of chickenpox   . Obesity   . Overactive bladder   . Arthritis   . Psoriasis of scalp   . GERD (gastroesophageal reflux disease)     occasional  . Frequency-urgency syndrome   . Diarrhea     Assessment/Plan: 1 Day Post-Op Procedure(s) (LRB): TOTAL KNEE ARTHROPLASTY (Right) Principal Problem:   OA (osteoarthritis) of  knee  Estimated body mass index is 40.96 kg/(m^2) as calculated from the following:   Height as of this encounter: 5\' 2"  (1.575 m).   Weight as of this encounter: 101.606 kg (224 lb). Advance diet Up with therapy Plan for discharge tomorrow Discharge home with home health  DVT Prophylaxis - Xarelto Weight-Bearing as tolerated to right leg No vaccines. D/C O2 and Pulse OX and try on Room Air  Henderson, Yolanda Sax 08/01/2012, 8:22 AM

## 2012-08-01 NOTE — Evaluation (Signed)
Occupational Therapy Evaluation Patient Details Name: Yolanda Henderson MRN: [id number] DOB: 23-May-1961 Today's Date: 08/01/2012 Time: 4098-1191 OT Time Calculation (min): 27 min  OT Assessment / Plan / Recommendation Clinical Impression  Pt is a 52 y/o female s/p R TKR . She was supervision toilet transfer & funct mob in room. Pt is currently Min A LB ADL's & plans for family to assist PRN @ d/c. She has DME & a/e, no further OT needs, will sign off at this time.    OT Assessment  Patient does not need any further OT services    Follow Up Recommendations    Recommend supervision-min a PRN @ d/c.   Barriers to Discharge      Equipment Recommendations    Has DME & a/e access   Recommendations for Other Services    Frequency       Precautions / Restrictions Precautions Precautions: Knee Required Braces or Orthoses: Knee Immobilizer - Right Knee Immobilizer - Right: Discontinue once straight leg raise with < 10 degree lag Restrictions Weight Bearing Restrictions: No Other Position/Activity Restrictions: WBAT   Pertinent Vitals/Pain Pt w/o c/o pain during assessment. Stated "It hurt earlier"    ADL  Eating/Feeding: Performed;Independent Where Assessed - Eating/Feeding: Chair Grooming: Performed;Wash/dry hands;Wash/dry face;Supervision/safety Where Assessed - Grooming: Unsupported standing Upper Body Bathing: Simulated;Set up Where Assessed - Upper Body Bathing: Unsupported sitting;Supported sitting Lower Body Bathing: Simulated;Minimal assistance Where Assessed - Lower Body Bathing: Unsupported sitting;Unsupported sit to stand Upper Body Dressing: Simulated;Set up Where Assessed - Upper Body Dressing: Supported sitting;Unsupported sitting Lower Body Dressing: Performed;Minimal assistance Where Assessed - Lower Body Dressing: Unsupported standing Toilet Transfer: Performed;Supervision/safety Toilet Transfer Method: Sit to stand Toilet Transfer Equipment: Raised toilet  seat with arms (or 3-in-1 over toilet) Toileting - Clothing Manipulation and Hygiene: Performed;Supervision/safety Where Assessed - Glass blower/designer Manipulation and Hygiene: Sit on 3-in-1 or toilet;Standing Tub/Shower Transfer Method: Not assessed Equipment Used: Knee Immobilizer;Rolling walker Transfers/Ambulation Related to ADLs: Pt was supervision level for functional mobility in room and bathroom as well as sit-stand from chair, using RW & R KI. When getting back in chair, pt required Min A for RLE  ADL Comments: Pt is a 52 y/o female s/p R TKR . She was supervision toilet transfer & funct mob in room. She was Min A LB ADL's & plans for family to assist PRN @ d/c. She has DME & a/e, no further OT needs, will sign off at this time.    OT Diagnosis:    OT Problem List:   OT Treatment Interventions:     OT Goals    Visit Information  Last OT Received On: 08/01/12 Assistance Needed: +1    Subjective Data  Subjective: "My mother has all that & i know how to use it" Re: a/e & DME 3:1 Patient Stated Goal: Return home w/ family PRN assist   Prior Functioning     Home Living Lives With: Family Available Help at Discharge: Family Type of Home: House Home Access: Level entry Home Layout: One level Bathroom Shower/Tub: Walk-in shower;Door [employer] Toilet: Handicapped height Bathroom Accessibility: Yes How Accessible: Accessible via walker Home Adaptive Equipment: Dan Humphreys - four wheeled;Walker - rolling;Shower chair with back;Hand-held shower hose;Grab bars in shower;Bedside commode/3-in-1 Additional Comments: Pt states home is handicapped accessible Prior Function Level of Independence: Independent Able to Take Stairs?: Yes Driving: Yes Vocation: Full time employment Communication Communication: No difficulties Dominant Hand: Right    Vision/Perception Vision - History Baseline Vision: Wears glasses only for  reading Patient Visual Report: No change from baseline    Cognition  Cognition Overall Cognitive Status: Appears within functional limits for tasks assessed/performed Arousal/Alertness: Awake/alert Orientation Level: Appears intact for tasks assessed Behavior During Session: [facility] for tasks performed    Extremity/Trunk Assessment Right Upper Extremity Assessment RUE ROM/Strength/Tone: [facility] for tasks assessed;Within functional levels RUE Coordination: WFL - gross/fine motor Left Upper Extremity Assessment LUE ROM/Strength/Tone: Within functional levels;WFL for tasks assessed LUE Coordination: WFL - gross/fine motor Right Lower Extremity Assessment RLE ROM/Strength/Tone: Deficits RLE ROM/Strength/Tone Deficits: Quads 2/5 with AAROM at knee -10 - 25 with muscle guarding Left Lower Extremity Assessment LLE ROM/Strength/Tone: WFL for tasks assessed     Mobility Bed Mobility Bed Mobility: Supine to Sit Supine to Sit: 4: Min assist Details for Bed Mobility Assistance: cues for sequence and use of L LE to self assist Transfers Transfers: Sit to Stand;Stand to Sit Sit to Stand: 5: Supervision;From chair/3-in-1 Stand to Sit: 5: Supervision;4: Min guard;To chair/3-in-1 Details for Transfer Assistance: cues for LE management and use of UEs to self assist     Exercise Total Joint Exercises Ankle Circles/Pumps: AROM;10 reps;Supine;Both Quad Sets: AROM;10 reps;Supine;Both Heel Slides: AAROM;10 reps;Supine;Right Straight Leg Raises: AAROM;Right;10 reps;Supine       End of Session OT - End of Session Equipment Utilized During Treatment: Right knee immobilizer;Other (comment) (RW) Activity Tolerance: Patient tolerated treatment well Patient left: in chair;with call bell/phone within reach CPM Right Knee CPM Right Knee: Off  GO     Alm Bustard 08/01/2012, 10:56 AM

## 2012-08-01 NOTE — Evaluation (Signed)
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 strength/ROM and post op pain limiting functional mobility    PT Assessment  Patient needs continued PT services    Follow Up Recommendations  Home health PT    Does the patient have the potential to tolerate intense rehabilitation      Barriers to Discharge None      Equipment Recommendations  None recommended by PT    Recommendations for Other Services OT consult   Frequency 7X/week    Precautions / Restrictions Precautions Precautions: Knee Required Braces or Orthoses: Knee Immobilizer - Right Knee Immobilizer - Right: Discontinue once straight leg raise with < 10 degree lag Restrictions Weight Bearing Restrictions: No Other Position/Activity Restrictions: WBAT   Pertinent Vitals/Pain 3-4/10 - premed, cold packs provided      Mobility  Bed Mobility Bed Mobility: Supine to Sit Supine to Sit: 4: Min assist Details for Bed Mobility Assistance: cues for sequence and use of L LE to self assist Transfers Transfers: Sit to Stand;Stand to Sit Sit to Stand: 4: Min assist;3: Mod assist Stand to Sit: 4: Min assist Details for Transfer Assistance: cues for LE management and use of UEs to self assist Ambulation/Gait Ambulation/Gait Assistance: 4: Min assist Ambulation Distance (Feet): 26 Feet Assistive device: Rolling walker Ambulation/Gait Assistance Details: cues for sequence, stride length, and position from RW Gait Pattern: Step-to pattern;Decreased step length - right;Decreased step length - left;Decreased stance time - right    Exercises Total Joint Exercises Ankle Circles/Pumps: AROM;10 reps;Supine;Both Quad Sets: AROM;10 reps;Supine;Both Heel Slides: AAROM;10  reps;Supine;Right Straight Leg Raises: AAROM;Right;10 reps;Supine   PT Diagnosis: Difficulty walking  PT Problem List: Decreased strength;Decreased range of motion;Decreased activity tolerance;Decreased mobility;Decreased knowledge of use of DME;Pain PT Treatment Interventions: DME instruction;Gait training;Stair training;Functional mobility training;Therapeutic activities;Therapeutic exercise;Patient/family education   PT Goals Acute Rehab PT Goals PT Goal Formulation: With patient Time For Goal Achievement: 08/05/12 Potential to Achieve Goals: Good Pt will go Supine/Side to Sit: with supervision PT Goal: Supine/Side to Sit - Progress: Goal set today Pt will go Sit to Supine/Side: with supervision PT Goal: Sit to Supine/Side - Progress: Goal set today Pt will go Sit to Stand: with supervision PT Goal: Sit to Stand - Progress: Goal set today Pt will go Stand to Sit: with supervision PT Goal: Stand to Sit - Progress: Goal set today Pt will Ambulate: 51 - 150 feet;with supervision;with rolling walker PT Goal: Ambulate - Progress: Goal set today  Visit Information  Last PT Received On: 08/01/12 Assistance Needed: +1    Subjective Data  Subjective: That wasn't as bad as I thought it would be Patient Stated Goal: Resume previous lifestyle with decreased pain   Prior Functioning  Home Living Lives With: Family Available Help at Discharge: Family Type of Home: House Home Access: Level entry Home Layout: One level Home Adaptive Equipment: Dan Humphreys - four wheeled;Walker - rolling Additional Comments: Pt states home is handicapped accessible Prior Function Level of Independence: Independent Able to Take Stairs?: Yes Driving: Yes Vocation: Full time employment Communication Communication: No difficulties Dominant Hand: Right    Cognition  Cognition Overall Cognitive Status: Appears within functional limits for tasks assessed/performed Arousal/Alertness: Awake/alert Orientation  Level: Appears intact for tasks assessed Behavior During Session: [facility] for tasks performed    Extremity/Trunk  Assessment Right Upper Extremity Assessment RUE ROM/Strength/Tone: WFL for tasks assessed Left Upper Extremity Assessment LUE ROM/Strength/Tone: WFL for tasks assessed Right Lower Extremity Assessment RLE ROM/Strength/Tone: Deficits RLE ROM/Strength/Tone Deficits: Quads 2/5 with AAROM at knee -10 - 25 with muscle guarding Left Lower Extremity Assessment LLE ROM/Strength/Tone: WFL for tasks assessed   Balance    End of Session PT - End of Session Equipment Utilized During Treatment: Gait belt;Right knee immobilizer Activity Tolerance: Patient tolerated treatment well;Other (comment) (mild nausea) Patient left: in chair;with call bell/phone within reach Nurse Communication: Mobility status CPM Right Knee CPM Right Knee: Off  GP     BRADSHAW,HUNTER 08/01/2012, 9:03 AM

## 2012-08-01 NOTE — Progress Notes (Signed)
Physical Therapy Treatment Patient Details Name: Yolanda "Cheryllynn Sarff MRN: [id number] DOB: 05/27/1960 Today's Date: 08/01/2012 Time: 4098-1191 PT Time Calculation (min): 18 min  PT Assessment / Plan / Recommendation Comments on Treatment Session       Follow Up Recommendations  Home health PT     Does the patient have the potential to tolerate intense rehabilitation     Barriers to Discharge        Equipment Recommendations  None recommended by PT    Recommendations for Other Services OT consult  Frequency 7X/week   Plan Discharge plan remains appropriate    Precautions / Restrictions Precautions Precautions: Knee Required Braces or Orthoses: Knee Immobilizer - Right Knee Immobilizer - Right: Discontinue once straight leg raise with < 10 degree lag Restrictions Weight Bearing Restrictions: No Other Position/Activity Restrictions: WBAT   Pertinent Vitals/Pain 2-3/10; premed    Mobility  Bed Mobility Bed Mobility: Supine to Sit;Sit to Supine Supine to Sit: 4: Min assist Sit to Supine: 4: Min assist Details for Bed Mobility Assistance: cues for sequence and use of L LE to self assist Transfers Transfers: Sit to Stand;Stand to Sit Sit to Stand: 5: Supervision;From chair/3-in-1;With armrests;From bed Stand to Sit: 5: Supervision;To chair/3-in-1;With upper extremity assist Details for Transfer Assistance: cues for LE management and use of UEs to self assist Ambulation/Gait Ambulation/Gait Assistance: 4: Min guard Ambulation Distance (Feet): 174 Feet Assistive device: Rolling walker Ambulation/Gait Assistance Details: min cues for sequence and position from RW Gait Pattern: Step-to pattern;Decreased step length - right;Decreased step length - left;Decreased stance time - right    Exercises     PT Diagnosis:    PT Problem List:   PT Treatment Interventions:     PT Goals Acute Rehab PT Goals PT Goal Formulation: With patient Time For Goal Achievement:  08/05/12 Potential to Achieve Goals: Good Pt will go Supine/Side to Sit: with supervision PT Goal: Supine/Side to Sit - Progress: Goal set today Pt will go Sit to Supine/Side: with supervision PT Goal: Sit to Supine/Side - Progress: Goal set today Pt will go Sit to Stand: with supervision PT Goal: Sit to Stand - Progress: Progressing toward goal Pt will go Stand to Sit: with supervision PT Goal: Stand to Sit - Progress: Progressing toward goal Pt will Ambulate: 51 - 150 feet;with supervision;with rolling walker PT Goal: Ambulate - Progress: Progressing toward goal  Visit Information  Last PT Received On: 08/01/12 Assistance Needed: +1    Subjective Data  Subjective: I am doing good; this is a piece of cake Patient Stated Goal: Resume previous lifestyle with decreased pain   Cognition  Cognition Overall Cognitive Status: Appears within functional limits for tasks assessed/performed Arousal/Alertness: Awake/alert Orientation Level: Appears intact for tasks assessed Behavior During Session: [facility] for tasks performed    Balance     End of Session PT - End of Session Equipment Utilized During Treatment: Right knee immobilizer Activity Tolerance: Patient tolerated treatment well Patient left: in bed;with call bell/phone within reach Nurse Communication: Mobility status CPM Right Knee CPM Right Knee: On   GP     BRADSHAW,HUNTER 08/01/2012, 3:29 PM

## 2012-08-01 NOTE — Progress Notes (Signed)
08/01/2012 Yajayra Feldt BSN RN CCM [phone] CM SPOKE WITH PATIENT. Plans are for patient to return to her home in [place] where mother and daughter will be her caregivers. She already has RW and plans to use [place] for [facility] services upon discharge.

## 2012-08-02 LAB — BASIC METABOLIC PANEL
BUN: 11 mg/dL (ref 6–23)
CO2: 27 mEq/L (ref 19–32)
Chloride: 101 mEq/L (ref 96–112)
Glucose, Bld: 141 mg/dL — ABNORMAL HIGH (ref 70–99)
Potassium: 3.9 mEq/L (ref 3.5–5.1)
Sodium: 136 mEq/L (ref 135–145)

## 2012-08-02 LAB — CBC
HCT: 33.5 % — ABNORMAL LOW (ref 36.0–46.0)
Hemoglobin: 11.3 g/dL — ABNORMAL LOW (ref 12.0–15.0)
MCHC: 33.7 g/dL (ref 30.0–36.0)
RBC: 3.7 MIL/uL — ABNORMAL LOW (ref 3.87–5.11)
WBC: 16.8 10*3/uL — ABNORMAL HIGH (ref 4.0–10.5)

## 2012-08-02 MED ORDER — TRAMADOL HCL 50 MG PO TABS: 50.0000 mg | ORAL_TABLET | Freq: Four times a day (QID) | ORAL | Status: DC | PRN

## 2012-08-02 MED ORDER — METHOCARBAMOL 500 MG PO TABS: 500.0000 mg | ORAL_TABLET | Freq: Four times a day (QID) | ORAL | Status: DC | PRN

## 2012-08-02 MED ORDER — RIVAROXABAN 10 MG PO TABS: 10.0000 mg | ORAL_TABLET | Freq: Every day | ORAL | Status: DC

## 2012-08-02 MED ORDER — OXYCODONE HCL 5 MG PO TABS: 5.0000 mg | ORAL_TABLET | ORAL | Status: DC | PRN

## 2012-08-02 NOTE — Progress Notes (Signed)
Physical Therapy Treatment Patient Details Name: Kyrene "Charlena Haub MRN: [id number] DOB: 21-Jul-1960 Today's Date: 08/02/2012 Time: 4098-1191 PT Time Calculation (min): 25 min  PT Assessment / Plan / Recommendation Comments on Treatment Session       Follow Up Recommendations  Home health PT     Does the patient have the potential to tolerate intense rehabilitation     Barriers to Discharge        Equipment Recommendations  None recommended by PT    Recommendations for Other Services OT consult  Frequency 7X/week   Plan Discharge plan remains appropriate    Precautions / Restrictions Precautions Precautions: Knee Required Braces or Orthoses: Knee Immobilizer - Right Knee Immobilizer - Right: Discontinue once straight leg raise with < 10 degree lag (Pt performed IND SLR this am) Restrictions Weight Bearing Restrictions: No Other Position/Activity Restrictions: WBAT   Pertinent Vitals/Pain 3/10; premed, cold packs provided    Mobility  Bed Mobility Bed Mobility: Supine to Sit;Sit to Supine Supine to Sit: 5: Supervision Sit to Supine: 5: Supervision Details for Bed Mobility Assistance: cues for assisting R LE with L Transfers Transfers: Sit to Stand;Stand to Sit Sit to Stand: 5: Supervision Stand to Sit: 5: Supervision Details for Transfer Assistance: cues for LE management and use of UEs to self assist Ambulation/Gait Ambulation/Gait Assistance: 5: Supervision Ambulation Distance (Feet): 190 Feet Assistive device: Rolling walker Ambulation/Gait Assistance Details: min cues for  posture and position from RW Gait Pattern: Step-to pattern;Step-through pattern;Decreased stance time - right Stairs: No    Exercises Total Joint Exercises Ankle Circles/Pumps: AROM;Supine;Both;20 reps Quad Sets: AROM;Supine;Both;20 reps Short Arc Quad: AAROM;AROM;15 reps;Seated;Both Heel Slides: AAROM;Supine;Right;20 reps Straight Leg Raises: AAROM;Right;Supine;AROM;20 reps   PT  Diagnosis:    PT Problem List:   PT Treatment Interventions:     PT Goals Acute Rehab PT Goals PT Goal Formulation: With patient Time For Goal Achievement: 08/05/12 Potential to Achieve Goals: Good Pt will go Supine/Side to Sit: with supervision PT Goal: Supine/Side to Sit - Progress: Met Pt will go Sit to Supine/Side: with supervision PT Goal: Sit to Supine/Side - Progress: Met Pt will go Sit to Stand: with supervision PT Goal: Sit to Stand - Progress: Met Pt will go Stand to Sit: with supervision PT Goal: Stand to Sit - Progress: Met Pt will Ambulate: 51 - 150 feet;with supervision;with rolling walker PT Goal: Ambulate - Progress: Met  Visit Information  Last PT Received On: 08/02/12 Assistance Needed: +1    Subjective Data  Subjective: I'm going home today Patient Stated Goal: Resume previous lifestyle with decreased pain   Cognition  Cognition Overall Cognitive Status: Appears within functional limits for tasks assessed/performed Arousal/Alertness: Awake/alert Orientation Level: Appears intact for tasks assessed Behavior During Session: [facility] for tasks performed    Balance     End of Session PT - End of Session Activity Tolerance: Patient tolerated treatment well Patient left: in chair;with call bell/phone within reach Nurse Communication: Mobility status   GP     BRADSHAW,HUNTER 08/02/2012, 12:20 PM

## 2012-08-02 NOTE — Discharge Summary (Signed)
Physician Discharge Summary   Patient ID: Yolanda Henderson MRN: [id number] DOB/AGE: 1960/06/17 52 y.o.  Admit date: 07/31/2012 Discharge date: 08/02/2012  Primary Diagnosis:  Osteoarthritis Right knee  Admission Diagnoses:  Past Medical History  Diagnosis Date  . History of chickenpox   . Obesity   . Overactive bladder   . Arthritis   . Psoriasis of scalp   . GERD (gastroesophageal reflux disease)     occasional  . Frequency-urgency syndrome   . Diarrhea    Discharge Diagnoses:   Principal Problem:   OA (osteoarthritis) of knee  Estimated body mass index is 40.96 kg/(m^2) as calculated from the following:   Height as of this encounter: 5\' 2"  (1.575 m).   Weight as of this encounter: 101.606 kg (224 lb).  Procedure:  Procedure(s) (LRB): TOTAL KNEE ARTHROPLASTY (Right)   Consults: None  HPI: Yolanda Henderson is a 52 y.o. year old female with end stage OA of her right knee with progressively worsening pain and dysfunction. She has constant pain, with activity and at rest and significant functional deficits with difficulties even with ADLs. She has had extensive non-op management including analgesics, injections of cortisone and viscosupplements, and home exercise program, but remains in significant pain with significant dysfunction.Radiographs show bone on bone arthritis medial and patellofemoral with varus deformity. She presents now for right Total Knee Arthroplasty.   Laboratory Data: Admission on 07/31/2012, Discharged on 08/02/2012  Component Date Value Range Status  . WBC 08/01/2012 12.5* 4.0 - 10.5 K/uL Final  . RBC 08/01/2012 3.89  3.87 - 5.11 MIL/uL Final  . Hemoglobin 08/01/2012 11.7* 12.0 - 15.0 g/dL Final  . HCT 78/46/9629 35.7* 36.0 - 46.0 % Final  . MCV 08/01/2012 91.8  78.0 - 100.0 fL Final  . MCH 08/01/2012 30.1  26.0 - 34.0 pg Final  . MCHC 08/01/2012 32.8  30.0 - 36.0 g/dL Final  . RDW 52/84/1324 12.3  11.5 - 15.5 % Final  . Platelets 08/01/2012  257  150 - 400 K/uL Final  . Sodium 08/01/2012 138  135 - 145 mEq/L Final  . Potassium 08/01/2012 3.8  3.5 - 5.1 mEq/L Final  . Chloride 08/01/2012 104  96 - 112 mEq/L Final  . CO2 08/01/2012 27  19 - 32 mEq/L Final  . Glucose, Bld 08/01/2012 116* 70 - 99 mg/dL Final  . BUN 40/02/2724 10  6 - 23 mg/dL Final  . Creatinine, Ser 08/01/2012 0.78  0.50 - 1.10 mg/dL Final  . Calcium 36/64/4034 7.8* 8.4 - 10.5 mg/dL Final  . GFR calc non Af Amer 08/01/2012 >90  >90 mL/min Final  . GFR calc Af Amer 08/01/2012 >90  >90 mL/min Final   Comment:                                 The eGFR has been calculated                          using the CKD EPI equation.                          This calculation has not been                          validated in all clinical  situations.                          eGFR's persistently                          <90 mL/min signify                          possible Chronic Kidney Disease.  . WBC 08/02/2012 16.8* 4.0 - 10.5 K/uL Final  . RBC 08/02/2012 3.70* 3.87 - 5.11 MIL/uL Final  . Hemoglobin 08/02/2012 11.3* 12.0 - 15.0 g/dL Final  . HCT 16/02/9603 33.5* 36.0 - 46.0 % Final  . MCV 08/02/2012 90.5  78.0 - 100.0 fL Final  . MCH 08/02/2012 30.5  26.0 - 34.0 pg Final  . MCHC 08/02/2012 33.7  30.0 - 36.0 g/dL Final  . RDW 54/01/8118 12.2  11.5 - 15.5 % Final  . Platelets 08/02/2012 275  150 - 400 K/uL Final  . Sodium 08/02/2012 136  135 - 145 mEq/L Final  . Potassium 08/02/2012 3.9  3.5 - 5.1 mEq/L Final  . Chloride 08/02/2012 101  96 - 112 mEq/L Final  . CO2 08/02/2012 27  19 - 32 mEq/L Final  . Glucose, Bld 08/02/2012 141* 70 - 99 mg/dL Final  . BUN 14/78/2956 11  6 - 23 mg/dL Final  . Creatinine, Ser 08/02/2012 0.69  0.50 - 1.10 mg/dL Final  . Calcium 21/30/8657 8.6  8.4 - 10.5 mg/dL Final  . GFR calc non Af Amer 08/02/2012 >90  >90 mL/min Final  . GFR calc Af Amer 08/02/2012 >90  >90 mL/min Final   Comment:                                  The eGFR has been calculated                          using the CKD EPI equation.                          This calculation has not been                          validated in all clinical                          situations.                          eGFR's persistently                          <90 mL/min signify                          possible Chronic Kidney Disease.  Hospital Outpatient Visit on 07/27/2012  Component Date Value Range Status  . MRSA, PCR 07/27/2012 NEGATIVE  NEGATIVE Final  . Staphylococcus aureus 07/27/2012 NEGATIVE  NEGATIVE Final   Comment:                                 The  Xpert SA Assay (FDA                          approved for NASAL specimens                          in patients over 78 years of age),                          is one component of                          a comprehensive surveillance                          program.  Test performance has                          been validated by [employer] for patients greater                          than or equal to 90 year old.                          It is not intended                          to diagnose infection nor to                          guide or monitor treatment.  Marland Kitchen aPTT 07/27/2012 31  24 - 37 seconds Final  . WBC 07/27/2012 7.4  4.0 - 10.5 K/uL Final  . RBC 07/27/2012 4.75  3.87 - 5.11 MIL/uL Final  . Hemoglobin 07/27/2012 14.4  12.0 - 15.0 g/dL Final  . HCT 21/30/8657 43.8  36.0 - 46.0 % Final  . MCV 07/27/2012 92.2  78.0 - 100.0 fL Final  . MCH 07/27/2012 30.3  26.0 - 34.0 pg Final  . MCHC 07/27/2012 32.9  30.0 - 36.0 g/dL Final  . RDW 84/69/6295 12.0  11.5 - 15.5 % Final  . Platelets 07/27/2012 300  150 - 400 K/uL Final  . Sodium 07/27/2012 140  135 - 145 mEq/L Final  . Potassium 07/27/2012 4.0  3.5 - 5.1 mEq/L Final  . Chloride 07/27/2012 104  96 - 112 mEq/L Final  . CO2 07/27/2012 27  19 - 32 mEq/L Final  . Glucose, Bld 07/27/2012 80  70 - 99  mg/dL Final  . BUN 28/41/3244 20  6 - 23 mg/dL Final  . Creatinine, Ser 07/27/2012 0.83  0.50 - 1.10 mg/dL Final  . Calcium 05/26/7251 9.0  8.4 - 10.5 mg/dL Final  . Total Protein 07/27/2012 7.0  6.0 - 8.3 g/dL Final  . Albumin 66/44/0347 3.7  3.5 - 5.2 g/dL Final  . AST 42/59/5638 17  0 - 37 U/L Final  . ALT 07/27/2012 19  0 - 35 U/L Final  . Alkaline Phosphatase 07/27/2012 92  39 - 117 U/L Final  . Total Bilirubin 07/27/2012 0.3  0.3 - 1.2 mg/dL Final  .  GFR calc non Af Amer 07/27/2012 80* >90 mL/min Final  . GFR calc Af Amer 07/27/2012 >90  >90 mL/min Final   Comment:                                 The eGFR has been calculated                          using the CKD EPI equation.                          This calculation has not been                          validated in all clinical                          situations.                          eGFR's persistently                          <90 mL/min signify                          possible Chronic Kidney Disease.  Marland Kitchen Prothrombin Time 07/27/2012 12.0  11.6 - 15.2 seconds Final  . INR 07/27/2012 0.89  0.00 - 1.49 Final  . ABO/RH(D) 07/27/2012 O POS   Final  . Antibody Screen 07/27/2012 NEG   Final  . Sample Expiration 07/27/2012 08/03/2012   Final  . Color, Urine 07/27/2012 YELLOW  YELLOW Final  . APPearance 07/27/2012 CLEAR  CLEAR Final  . Specific Gravity, Urine 07/27/2012 1.022  1.005 - 1.030 Final  . pH 07/27/2012 5.5  5.0 - 8.0 Final  . Glucose, UA 07/27/2012 NEGATIVE  NEGATIVE mg/dL Final  . Hgb urine dipstick 07/27/2012 SMALL* NEGATIVE Final  . Bilirubin Urine 07/27/2012 NEGATIVE  NEGATIVE Final  . Ketones, ur 07/27/2012 NEGATIVE  NEGATIVE mg/dL Final  . Protein, ur 45/40/9811 NEGATIVE  NEGATIVE mg/dL Final  . Urobilinogen, UA 07/27/2012 0.2  0.0 - 1.0 mg/dL Final  . Nitrite 91/47/8295 NEGATIVE  NEGATIVE Final  . Leukocytes, UA 07/27/2012 NEGATIVE  NEGATIVE Final  . ABO/RH(D) 07/27/2012 O POS   Final  . Specimen  Description 07/27/2012 URINE, CLEAN CATCH   Final  . Special Requests 07/27/2012 NONE   Final  . Culture  Setup Time 07/27/2012 07/27/2012 15:20   Final  . Colony Count 07/27/2012 10,000 COLONIES/ML   Final  . Culture 07/27/2012 Multiple bacterial morphotypes present, none predominant. Suggest appropriate recollection if clinically indicated.   Final  . Report Status 07/27/2012 07/28/2012 FINAL   Final  . Squamous Epithelial / LPF 07/27/2012 RARE  RARE Final  . RBC / HPF 07/27/2012 3-6  <3 RBC/hpf Final  . Bacteria, UA 07/27/2012 RARE  RARE Final     X-Rays:No results found.  EKG:No orders found for this or any previous visit.   Hospital Course: Yolanda Henderson is a 52 y.o. who was admitted to [facility]. They were brought to the operating room on 07/31/2012 and underwent Procedure(s): TOTAL KNEE ARTHROPLASTY.  Patient tolerated the procedure well and was later transferred to the  recovery room and then to the orthopaedic floor for postoperative care.  They were given PO and IV analgesics for pain control following their surgery.  They were given 24 hours of postoperative antibiotics of  Anti-infectives   Start     Dose/Rate Route Frequency Ordered Stop   07/31/12 1430  ceFAZolin (ANCEF) IVPB 2 g/50 mL premix     2 g 100 mL/hr over 30 Minutes Intravenous Every 6 hours 07/31/12 1131 07/31/12 2039   07/31/12 0615  ceFAZolin (ANCEF) IVPB 2 g/50 mL premix     2 g 100 mL/hr over 30 Minutes Intravenous On call to O.R. 07/31/12 0600 07/31/12 0812     and started on DVT prophylaxis in the form of Xarelto.   PT and OT were ordered for total joint protocol.  Discharge planning consulted to help with postop disposition and equipment needs.  Patient had a rough night on the evening of surgery but was doing better the next morning.  They started to get up OOB with therapy on day one and walked over 170 feet. Hemovac drain was pulled without difficulty.  Continued to work with therapy into  day two.  Dressing was changed on day two and the incision was healing well.  Patient was seen in rounds and was ready to go home later that afternoon on day 2.   Discharge Medications: Prior to Admission medications   Medication Sig Start Date End Date Taking? Authorizing Provider  tolterodine (DETROL LA) 4 MG 24 hr capsule Take 4 mg by mouth daily.   Yes Historical Provider, MD  methocarbamol (ROBAXIN) 500 MG tablet Take 1 tablet (500 mg total) by mouth every 6 (six) hours as needed. 08/02/12   Alexzandrew Perkins, PA-C  oxyCODONE (OXY IR/ROXICODONE) 5 MG immediate release tablet Take 1-2 tablets (5-10 mg total) by mouth every 3 (three) hours as needed. 08/02/12   Alexzandrew Julien Girt, PA-C  rivaroxaban (XARELTO) 10 MG TABS tablet Take 1 tablet (10 mg total) by mouth daily with breakfast. 08/02/12   Alexzandrew Julien Girt, PA-C  traMADol (ULTRAM) 50 MG tablet Take 1-2 tablets (50-100 mg total) by mouth every 6 (six) hours as needed. 08/02/12   Alexzandrew Julien Girt, PA-C    Diet: Regular diet Activity:WBAT Follow-up:in 2 weeks Disposition - Home Discharged Condition: good       Discharge Orders   Future Appointments Provider Department Dept Phone   02/26/2013 3:45 PM Alison Murray, MD [facility] [phone]   Future Orders Complete By Expires     Call MD / Call 911  As directed     Comments:      If you experience chest pain or shortness of breath, CALL 911 and be transported to the hospital emergency room.  If you develope a fever above 101 F, pus (white drainage) or increased drainage or redness at the wound, or calf pain, call your surgeon's office.    Change dressing  As directed     Comments:      Change dressing daily with sterile 4 x 4 inch gauze dressing and apply TED hose. Do not submerge the incision under water.    Constipation Prevention  As directed     Comments:      Drink plenty of fluids.  Prune juice may be helpful.  You may use a stool softener, such  as Colace (over the counter) 100 mg twice a day.  Use MiraLax (over the counter) for constipation as needed.    Diet general  As directed     Discharge instructions  As directed     Comments:      Pick up stool softner and laxative for home. Do not submerge incision under water. May shower. Continue to use ice for pain and swelling from surgery.  Take Xarelto for two and a half more weeks, then discontinue Xarelto.    Do not put a pillow under the knee. Place it under the heel.  As directed     Do not sit on low chairs, stoools or toilet seats, as it may be difficult to get up from low surfaces  As directed     Driving restrictions  As directed     Comments:      No driving until released by the physician.    Increase activity slowly as tolerated  As directed     Lifting restrictions  As directed     Comments:      No lifting until released by the physician.    Patient may shower  As directed     Comments:      You may shower without a dressing once there is no drainage.  Do not wash over the wound.  If drainage remains, do not shower until drainage stops.    TED hose  As directed     Comments:      Use stockings (TED hose) for 3 weeks on both leg(s).  You may remove them at night for sleeping.    Weight bearing as tolerated  As directed         Medication List    STOP taking these medications       glucosamine-chondroitin 500-400 MG tablet     ibuprofen 200 MG tablet  Commonly known as:  ADVIL,MOTRIN     multivitamin with minerals Tabs      TAKE these medications       methocarbamol 500 MG tablet  Commonly known as:  ROBAXIN  Take 1 tablet (500 mg total) by mouth every 6 (six) hours as needed.     oxyCODONE 5 MG immediate release tablet  Commonly known as:  Oxy IR/ROXICODONE  Take 1-2 tablets (5-10 mg total) by mouth every 3 (three) hours as needed.     rivaroxaban 10 MG Tabs tablet  Commonly known as:  XARELTO  Take 1 tablet (10 mg total) by mouth daily with  breakfast.     tolterodine 4 MG 24 hr capsule  Commonly known as:  DETROL LA  Take 4 mg by mouth daily.     traMADol 50 MG tablet  Commonly known as:  ULTRAM  Take 1-2 tablets (50-100 mg total) by mouth every 6 (six) hours as needed.       Follow-up Information   Follow up with Loanne Drilling, MD. Schedule an appointment as soon as possible for a visit in 2 weeks.   Contact information:   [address], [address] [place] [address] [phone]       Signed: Patrica Duel 08/15/2012, 8:35 AM

## 2012-08-02 NOTE — Progress Notes (Signed)
   Subjective: 2 Days Post-Op Procedure(s) (LRB): TOTAL KNEE ARTHROPLASTY (Right) Patient reports pain as mild.   Patient seen in rounds with Dr. Lequita Halt. Patient is well, and has had no acute complaints or problems Patient is ready to go home later today.  Objective: Vital signs in last 24 hours: Temp:  [98.6 F (37 C)] 98.6 F (37 C) (03/12 0534) Pulse Rate:  [74-101] 74 (03/12 0534) Resp:  [16] 16 (03/12 0534) BP: (116-144)/(79-80) 116/79 mmHg (03/12 0534) SpO2:  [91 %-97 %] 91 % (03/12 0534)  Intake/Output from previous day:  Intake/Output Summary (Last 24 hours) at 08/02/12 1344 Last data filed at 08/02/12 1234  Gross per 24 hour  Intake   1560 ml  Output   1677 ml  Net   -117 ml    Intake/Output this shift: Total I/O In: 600 [P.O.:600] Out: 2 [Urine:2]  Labs:  Recent Labs  08/01/12 0452 08/02/12 0442  HGB 11.7* 11.3*    Recent Labs  08/01/12 0452 08/02/12 0442  WBC 12.5* 16.8*  RBC 3.89 3.70*  HCT 35.7* 33.5*  PLT 257 275    Recent Labs  08/01/12 0452 08/02/12 0442  NA 138 136  K 3.8 3.9  CL 104 101  CO2 27 27  BUN 10 11  CREATININE 0.78 0.69  GLUCOSE 116* 141*  CALCIUM 7.8* 8.6   No results found for this basename: LABPT, INR,  in the last 72 hours  EXAM: General - Patient is Alert, Appropriate and Oriented Extremity - Neurovascular intact Sensation intact distally Dorsiflexion/Plantar flexion intact No cellulitis present Incision - clean, dry, no drainage, healing Motor Function - intact, moving foot and toes well on exam.   Assessment/Plan: 2 Days Post-Op Procedure(s) (LRB): TOTAL KNEE ARTHROPLASTY (Right) Procedure(s) (LRB): TOTAL KNEE ARTHROPLASTY (Right) Past Medical History  Diagnosis Date  . History of chickenpox   . Obesity   . Overactive bladder   . Arthritis   . Psoriasis of scalp   . GERD (gastroesophageal reflux disease)     occasional  . Frequency-urgency syndrome   . Diarrhea    Principal Problem:   OA  (osteoarthritis) of knee  Estimated body mass index is 40.96 kg/(m^2) as calculated from the following:   Height as of this encounter: 5\' 2"  (1.575 m).   Weight as of this encounter: 101.606 kg (224 lb). Up with therapy Discharge home with home health Diet - Regular diet Follow up - in 2 weeks Activity - WBAT Disposition - Home Condition Upon Discharge - Good D/C Meds - See DC Summary DVT Prophylaxis - Xarelto  PERKINS, ALEXZANDREW 08/02/2012, 1:44 PM

## 2012-08-15 DIAGNOSIS — D62 Acute posthemorrhagic anemia: Secondary | ICD-10-CM

## 2012-08-21 ENCOUNTER — Ambulatory Visit: Payer: BC Managed Care – PPO | Attending: Orthopedic Surgery | Admitting: Physical Therapy

## 2012-08-21 DIAGNOSIS — M25569 Pain in unspecified knee: Secondary | ICD-10-CM | POA: Insufficient documentation

## 2012-08-21 DIAGNOSIS — IMO0001 Reserved for inherently not codable concepts without codable children: Secondary | ICD-10-CM | POA: Insufficient documentation

## 2012-08-21 DIAGNOSIS — Z96659 Presence of unspecified artificial knee joint: Secondary | ICD-10-CM | POA: Insufficient documentation

## 2012-08-21 DIAGNOSIS — R269 Unspecified abnormalities of gait and mobility: Secondary | ICD-10-CM | POA: Insufficient documentation

## 2012-08-23 ENCOUNTER — Ambulatory Visit: Payer: BC Managed Care – PPO | Attending: Orthopedic Surgery | Admitting: Physical Therapy

## 2012-08-23 DIAGNOSIS — R269 Unspecified abnormalities of gait and mobility: Secondary | ICD-10-CM | POA: Insufficient documentation

## 2012-08-23 DIAGNOSIS — M25669 Stiffness of unspecified knee, not elsewhere classified: Secondary | ICD-10-CM | POA: Insufficient documentation

## 2012-08-23 DIAGNOSIS — IMO0001 Reserved for inherently not codable concepts without codable children: Secondary | ICD-10-CM | POA: Insufficient documentation

## 2012-08-24 ENCOUNTER — Ambulatory Visit: Payer: BC Managed Care – PPO | Admitting: Rehabilitation

## 2012-08-25 ENCOUNTER — Encounter: Payer: BC Managed Care – PPO | Admitting: Physical Therapy

## 2012-08-28 ENCOUNTER — Ambulatory Visit: Payer: BC Managed Care – PPO | Admitting: Rehabilitation

## 2012-08-30 ENCOUNTER — Ambulatory Visit: Payer: BC Managed Care – PPO | Admitting: Physical Therapy

## 2012-09-01 ENCOUNTER — Ambulatory Visit: Payer: BC Managed Care – PPO | Admitting: Rehabilitation

## 2012-09-04 ENCOUNTER — Ambulatory Visit: Payer: BC Managed Care – PPO | Admitting: Rehabilitation

## 2012-09-06 ENCOUNTER — Ambulatory Visit: Payer: BC Managed Care – PPO | Admitting: Physical Therapy

## 2012-09-07 ENCOUNTER — Ambulatory Visit: Payer: BC Managed Care – PPO | Admitting: Physical Therapy

## 2012-09-11 ENCOUNTER — Ambulatory Visit: Payer: BC Managed Care – PPO | Admitting: Rehabilitation

## 2012-09-13 ENCOUNTER — Ambulatory Visit: Payer: BC Managed Care – PPO | Admitting: Physical Therapy

## 2012-09-15 ENCOUNTER — Ambulatory Visit: Payer: BC Managed Care – PPO | Admitting: Physical Therapy

## 2012-09-19 ENCOUNTER — Ambulatory Visit: Payer: BC Managed Care – PPO | Admitting: Rehabilitation

## 2012-09-20 ENCOUNTER — Ambulatory Visit: Payer: BC Managed Care – PPO | Admitting: Physical Therapy

## 2012-09-22 ENCOUNTER — Ambulatory Visit: Payer: BC Managed Care – PPO | Attending: Orthopedic Surgery | Admitting: Rehabilitation

## 2012-09-22 DIAGNOSIS — R269 Unspecified abnormalities of gait and mobility: Secondary | ICD-10-CM | POA: Insufficient documentation

## 2012-09-22 DIAGNOSIS — IMO0001 Reserved for inherently not codable concepts without codable children: Secondary | ICD-10-CM | POA: Insufficient documentation

## 2012-09-22 DIAGNOSIS — M25669 Stiffness of unspecified knee, not elsewhere classified: Secondary | ICD-10-CM | POA: Insufficient documentation

## 2012-09-25 ENCOUNTER — Ambulatory Visit: Payer: BC Managed Care – PPO | Admitting: Physical Therapy

## 2012-09-27 ENCOUNTER — Ambulatory Visit: Payer: BC Managed Care – PPO | Admitting: Rehabilitation

## 2012-09-29 ENCOUNTER — Ambulatory Visit: Payer: BC Managed Care – PPO | Admitting: Physical Therapy

## 2012-10-02 ENCOUNTER — Ambulatory Visit: Payer: BC Managed Care – PPO | Admitting: Physical Therapy

## 2012-10-04 ENCOUNTER — Ambulatory Visit: Payer: BC Managed Care – PPO | Admitting: Rehabilitation

## 2012-10-06 ENCOUNTER — Ambulatory Visit: Payer: BC Managed Care – PPO | Admitting: Physical Therapy

## 2012-10-11 ENCOUNTER — Ambulatory Visit: Payer: BC Managed Care – PPO | Admitting: Rehabilitation

## 2012-10-12 ENCOUNTER — Ambulatory Visit: Payer: BC Managed Care – PPO | Admitting: Rehabilitation

## 2013-02-26 ENCOUNTER — Ambulatory Visit: Payer: Self-pay | Admitting: Obstetrics and Gynecology

## 2013-02-26 ENCOUNTER — Ambulatory Visit: Payer: Self-pay | Admitting: Gynecology

## 2014-02-14 ENCOUNTER — Telehealth: Payer: Self-pay | Admitting: *Deleted

## 2014-02-14 NOTE — Telephone Encounter (Signed)
Message left on mobile voicemail to return call to schedule next AEX. No answer at home number.

## 2014-03-28 NOTE — Telephone Encounter (Signed)
Pt did not return call.  02 recall.  No further followup.

## 2014-04-06 ENCOUNTER — Emergency Department (HOSPITAL_BASED_OUTPATIENT_CLINIC_OR_DEPARTMENT_OTHER)
Admission: EM | Admit: 2014-04-06 | Discharge: 2014-04-06 | Disposition: A | Discharge status: Home / Self Care | Payer: BC Managed Care – PPO | Attending: Emergency Medicine | Admitting: Emergency Medicine

## 2014-04-06 ENCOUNTER — Encounter (HOSPITAL_BASED_OUTPATIENT_CLINIC_OR_DEPARTMENT_OTHER): Payer: Self-pay | Admitting: *Deleted

## 2014-04-06 DIAGNOSIS — H02843 Edema of right eye, unspecified eyelid: Secondary | ICD-10-CM

## 2014-04-06 DIAGNOSIS — Z87448 Personal history of other diseases of urinary system: Secondary | ICD-10-CM | POA: Diagnosis not present

## 2014-04-06 DIAGNOSIS — H02841 Edema of right upper eyelid: Secondary | ICD-10-CM | POA: Insufficient documentation

## 2014-04-06 DIAGNOSIS — Z8719 Personal history of other diseases of the digestive system: Secondary | ICD-10-CM | POA: Diagnosis not present

## 2014-04-06 DIAGNOSIS — E669 Obesity, unspecified: Secondary | ICD-10-CM | POA: Insufficient documentation

## 2014-04-06 DIAGNOSIS — Z8619 Personal history of other infectious and parasitic diseases: Secondary | ICD-10-CM | POA: Insufficient documentation

## 2014-04-06 DIAGNOSIS — Z872 Personal history of diseases of the skin and subcutaneous tissue: Secondary | ICD-10-CM | POA: Diagnosis not present

## 2014-04-06 DIAGNOSIS — Z8739 Personal history of other diseases of the musculoskeletal system and connective tissue: Secondary | ICD-10-CM | POA: Insufficient documentation

## 2014-04-06 DIAGNOSIS — Z79899 Other long term (current) drug therapy: Secondary | ICD-10-CM | POA: Diagnosis not present

## 2014-04-06 DIAGNOSIS — Z7901 Long term (current) use of anticoagulants: Secondary | ICD-10-CM | POA: Diagnosis not present

## 2014-04-06 MED ORDER — TRAMADOL HCL 50 MG PO TABS: 50.0000 mg | ORAL_TABLET | Freq: Four times a day (QID) | ORAL | Status: DC | PRN

## 2014-04-06 NOTE — ED Provider Notes (Signed)
TIME SEEN: 2:30 PM  CHIEF COMPLAINT: right eyelid swelling, erythema  HPI: Pt is a 53 y.o. F with history of psoriasis who presents to the emergency department with right eyelid swelling, erythema and warmth that has been present since Thursday, 2 days ago. She states she was seen at the physician at her work who told her she may have shingles versus a bacterial infection. She was started on Valtrex and Bactrim. She reports that she has had some itching to this area but no drainage or open lesions. She states that this started as a "small bump" at the lateral aspect of her right eyebrow and now she notices multiple small lesions over her right eyelid.  Denies any eye pain, vision changes, photophobia. No headache. No fever. No history of injury to the area. No new soaps, lotions, detergents, medications. No other rash or lesions.   ROS: See HPI Constitutional: no fever  Eyes: no drainage  ENT: no runny nose   Cardiovascular:  no chest pain  Resp: no SOB  GI: no vomiting GU: no dysuria Integumentary: no rash  Allergy: no hives  Musculoskeletal: no leg swelling  Neurological: no slurred speech ROS otherwise negative  PAST MEDICAL HISTORY/PAST SURGICAL HISTORY:  Past Medical History  Diagnosis Date  . History of chickenpox   . Obesity   . Overactive bladder   . Arthritis   . Psoriasis of scalp   . GERD (gastroesophageal reflux disease)     occasional  . Frequency-urgency syndrome   . Diarrhea     MEDICATIONS:  Prior to Admission medications   Medication Sig Start Date End Date Taking? Authorizing Provider  methocarbamol (ROBAXIN) 500 MG tablet Take 1 tablet (500 mg total) by mouth every 6 (six) hours as needed. 08/02/12   Avel Peacerew Perkins, PA-C  oxyCODONE (OXY IR/ROXICODONE) 5 MG immediate release tablet Take 1-2 tablets (5-10 mg total) by mouth every 3 (three) hours as needed. 08/02/12   Avel Peacerew Perkins, PA-C  rivaroxaban (XARELTO) 10 MG TABS tablet Take 1 tablet (10 mg total) by mouth  daily with breakfast. 08/02/12   Avel Peacerew Perkins, PA-C  tolterodine (DETROL LA) 4 MG 24 hr capsule Take 4 mg by mouth daily.    Historical Provider, MD  traMADol (ULTRAM) 50 MG tablet Take 1-2 tablets (50-100 mg total) by mouth every 6 (six) hours as needed. 08/02/12   Avel Peacerew Perkins, PA-C    ALLERGIES:  No Known Allergies  SOCIAL HISTORY:  History  Substance Use Topics  . Smoking status: Never Smoker   . Smokeless tobacco: Never Used  . Alcohol Use: No    FAMILY HISTORY: Family History  Problem Relation Age of Onset  . Hypertension Mother   . Coronary artery disease Mother   . Lung cancer Paternal Grandmother   . Colon cancer Maternal Grandfather   . Other Mother     pacemaker    EXAM: BP 122/60 mmHg  Pulse 82  Temp(Src) 98.5 F (36.9 C) (Oral)  Resp 18  Ht 5\' 2"  (1.575 m)  Wt 180 lb (81.647 kg)  BMI 32.91 kg/m2  SpO2 96% CONSTITUTIONAL: Alert and oriented and responds appropriately to questions. Well-appearing; well-nourished HEAD: Normocephalic EYES: Conjunctivae clear, PERRL; Extraocular movements intact, no pain with consensual light response, no foreign body appreciated, patient does have small amount of erythema and swelling to the right upper eyelid without fluctuance or induration, there is no pain with extraocular eye movements, there are 3 small 2-253mm flat macular lesions on the right eyelid but  no vesicles, no drainage ENT: normal nose; no rhinorrhea; moist mucous membranes; pharynx without lesions noted NECK: Supple, no meningismus, no LAD  CARD: RRR; S1 and S2 appreciated; no murmurs, no clicks, no rubs, no gallops RESP: Normal chest excursion without splinting or tachypnea; breath sounds clear and equal bilaterally; no wheezes, no rhonchi, no rales,  ABD/GI: Normal bowel sounds; non-distended; soft, non-tender, no rebound, no guarding BACK:  The back appears normal and is non-tender to palpation, there is no CVA tenderness EXT: Normal ROM in all joints;  non-tender to palpation; no edema; normal capillary refill; no cyanosis    SKIN: Normal color for age and race; warm NEURO: Moves all extremities equally PSYCH: The patient's mood and manner are appropriate. Grooming and personal hygiene are appropriate.  MEDICAL DECISION MAKING: Pt here with right upper eyelid swelling and erythema.  No history of injury. No sign of pre-or post septal cellulitis.  She does have 3 small macular lesions over the right eyelid but there are no vesicles. Discussed with patient that this may be infectious in nature and that I recommend continuing Bactrim but this could also be early onset shingles and she is already on Valtrex.  Doubt that this is allergic in nature given she does not have any rash, redness anywhere else and the other eyelid is not involved. She does not have any eye pain, vision changes. She does have an appointment with ophthalmologist next week. She does not work glasses or contacts. Have advised her to follow-up with her PCP. Discussed return precautions. Patient verbalizes understanding and is comfortable with plan.       Layla MawKristen N Keylee Shrestha, DO 04/06/14 [id number]

## 2014-04-06 NOTE — ED Notes (Signed)
Patients right eye is red and swollen. Yesterday she saw her dr who put her on valtrex and bactrim, she states that this morning the inflammation is worse.

## 2014-04-06 NOTE — Discharge Instructions (Signed)
Possible Early Shingles Shingles (herpes zoster) is an infection that is caused by the same virus that causes chickenpox (varicella). The infection causes a painful skin rash and fluid-filled blisters, which eventually break open, crust over, and heal. It may occur in any area of the body, but it usually affects only one side of the body or face. The pain of shingles usually lasts about 1 month. However, some people with shingles may develop long-term (chronic) pain in the affected area of the body. Shingles often occurs many years after the person had chickenpox. It is more common:  In people older than 50 years.  In people with weakened immune systems, such as those with HIV, AIDS, or cancer.  In people taking medicines that weaken the immune system, such as transplant medicines.  In people under great stress. CAUSES  Shingles is caused by the varicella zoster virus (VZV), which also causes chickenpox. After a person is infected with the virus, it can remain in the person's body for years in an inactive state (dormant). To cause shingles, the virus reactivates and breaks out as an infection in a nerve root. The virus can be spread from person to person (contagious) through contact with open blisters of the shingles rash. It will only spread to people who have not had chickenpox. When these people are exposed to the virus, they may develop chickenpox. They will not develop shingles. Once the blisters scab over, the person is no longer contagious and cannot spread the virus to others. SIGNS AND SYMPTOMS  Shingles shows up in stages. The initial symptoms may be pain, itching, and tingling in an area of the skin. This pain is usually described as burning, stabbing, or throbbing.In a few days or weeks, a painful red rash will appear in the area where the pain, itching, and tingling were felt. The rash is usually on one side of the body in a band or belt-like pattern. Then, the rash usually turns into  fluid-filled blisters. They will scab over and dry up in approximately 2-3 weeks. Flu-like symptoms may also occur with the initial symptoms, the rash, or the blisters. These may include:  Fever.  Chills.  Headache.  Upset stomach. DIAGNOSIS  Your health care provider will perform a skin exam to diagnose shingles. Skin scrapings or fluid samples may also be taken from the blisters. This sample will be examined under a microscope or sent to a lab for further testing. TREATMENT  There is no specific cure for shingles. Your health care provider will likely prescribe medicines to help you manage the pain, recover faster, and avoid long-term problems. This may include antiviral drugs, anti-inflammatory drugs, and pain medicines. HOME CARE INSTRUCTIONS   Take a cool bath or apply cool compresses to the area of the rash or blisters as directed. This may help with the pain and itching.   Take medicines only as directed by your health care provider.   Rest as directed by your health care provider.  Keep your rash and blisters clean with mild soap and cool water or as directed by your health care provider.  Do not pick your blisters or scratch your rash. Apply an anti-itch cream or numbing creams to the affected area as directed by your health care provider.  Keep your shingles rash covered with a loose bandage (dressing).  Avoid skin contact with:  Babies.   Pregnant women.   Children with eczema.   Elderly people with transplants.   People with chronic illnesses, such  as leukemia or AIDS.   Wear loose-fitting clothing to help ease the pain of material rubbing against the rash.  Keep all follow-up visits as directed by your health care provider.If the area involved is on your face, you may receive a referral for a specialist, such as an eye doctor (ophthalmologist) or an ear, nose, and throat (ENT) doctor. Keeping all follow-up visits will help you avoid eye problems,  chronic pain, or disability.  SEEK IMMEDIATE MEDICAL CARE IF:   You have facial pain, pain around the eye area, or loss of feeling on one side of your face.  You have ear pain or ringing in your ear.  You have loss of taste.  Your pain is not relieved with prescribed medicines.   Your redness or swelling spreads.   You have more pain and swelling.  Your condition is worsening or has changed.   You have a fever. MAKE SURE YOU:  Understand these instructions.  Will watch your condition.  Will get help right away if you are not doing well or get worse. Document Released: 05/10/2005 Document Revised: 09/24/2013 Document Reviewed: 12/23/2011 [facility] Patient Information 2015 [place]. This information is not intended to replace advice given to you by your health care provider. Make sure you discuss any questions you have with your health care provider.

## 2014-12-02 ENCOUNTER — Other Ambulatory Visit: Payer: Self-pay

## 2014-12-02 DIAGNOSIS — Z1231 Encounter for screening mammogram for malignant neoplasm of breast: Secondary | ICD-10-CM

## 2015-04-14 ENCOUNTER — Ambulatory Visit: Payer: Self-pay

## 2015-04-16 ENCOUNTER — Ambulatory Visit
Admission: RE | Admit: 2015-04-16 | Discharge: 2015-04-16 | Disposition: A | Discharge status: Home / Self Care | Payer: BLUE CROSS/BLUE SHIELD | Source: Ambulatory Visit

## 2015-04-16 DIAGNOSIS — Z1231 Encounter for screening mammogram for malignant neoplasm of breast: Secondary | ICD-10-CM

## 2016-06-10 ENCOUNTER — Other Ambulatory Visit (HOSPITAL_COMMUNITY): Payer: Self-pay | Admitting: Family Medicine

## 2016-06-10 DIAGNOSIS — Z1231 Encounter for screening mammogram for malignant neoplasm of breast: Secondary | ICD-10-CM

## 2016-07-12 ENCOUNTER — Ambulatory Visit: Payer: BLUE CROSS/BLUE SHIELD

## 2016-08-03 ENCOUNTER — Ambulatory Visit: Payer: BLUE CROSS/BLUE SHIELD

## 2016-08-04 ENCOUNTER — Ambulatory Visit
Admission: RE | Admit: 2016-08-04 | Discharge: 2016-08-04 | Disposition: A | Discharge status: Home / Self Care | Payer: BLUE CROSS/BLUE SHIELD | Source: Ambulatory Visit | Attending: Family Medicine | Admitting: Family Medicine

## 2016-08-04 DIAGNOSIS — Z1231 Encounter for screening mammogram for malignant neoplasm of breast: Secondary | ICD-10-CM

## 2016-08-05 ENCOUNTER — Other Ambulatory Visit (HOSPITAL_COMMUNITY): Payer: Self-pay | Admitting: Family Medicine

## 2016-08-05 DIAGNOSIS — R928 Other abnormal and inconclusive findings on diagnostic imaging of breast: Secondary | ICD-10-CM

## 2016-08-24 ENCOUNTER — Other Ambulatory Visit: Payer: Self-pay | Admitting: Unknown Physician Specialty

## 2016-08-24 DIAGNOSIS — R928 Other abnormal and inconclusive findings on diagnostic imaging of breast: Secondary | ICD-10-CM

## 2016-08-27 ENCOUNTER — Other Ambulatory Visit: Payer: Self-pay | Admitting: Unknown Physician Specialty

## 2016-08-27 ENCOUNTER — Ambulatory Visit
Admission: RE | Admit: 2016-08-27 | Discharge: 2016-08-27 | Disposition: A | Discharge status: Home / Self Care | Payer: BLUE CROSS/BLUE SHIELD | Source: Ambulatory Visit | Attending: Unknown Physician Specialty | Admitting: Unknown Physician Specialty

## 2016-08-27 DIAGNOSIS — R928 Other abnormal and inconclusive findings on diagnostic imaging of breast: Secondary | ICD-10-CM

## 2016-08-27 DIAGNOSIS — R921 Mammographic calcification found on diagnostic imaging of breast: Secondary | ICD-10-CM

## 2016-08-31 ENCOUNTER — Ambulatory Visit
Admission: RE | Admit: 2016-08-31 | Discharge: 2016-08-31 | Disposition: A | Discharge status: Home / Self Care | Payer: BLUE CROSS/BLUE SHIELD | Source: Ambulatory Visit | Attending: Unknown Physician Specialty | Admitting: Unknown Physician Specialty

## 2016-08-31 ENCOUNTER — Other Ambulatory Visit: Payer: Self-pay | Admitting: Unknown Physician Specialty

## 2016-08-31 DIAGNOSIS — R921 Mammographic calcification found on diagnostic imaging of breast: Secondary | ICD-10-CM

## 2016-09-02 ENCOUNTER — Other Ambulatory Visit: Payer: Self-pay | Admitting: Unknown Physician Specialty

## 2016-09-02 DIAGNOSIS — R921 Mammographic calcification found on diagnostic imaging of breast: Secondary | ICD-10-CM

## 2016-09-06 ENCOUNTER — Ambulatory Visit
Admission: RE | Admit: 2016-09-06 | Discharge: 2016-09-06 | Disposition: A | Discharge status: Home / Self Care | Payer: BLUE CROSS/BLUE SHIELD | Source: Ambulatory Visit | Attending: Unknown Physician Specialty | Admitting: Unknown Physician Specialty

## 2016-09-06 ENCOUNTER — Other Ambulatory Visit: Payer: Self-pay | Admitting: Unknown Physician Specialty

## 2016-09-06 DIAGNOSIS — R921 Mammographic calcification found on diagnostic imaging of breast: Secondary | ICD-10-CM

## 2016-09-27 ENCOUNTER — Other Ambulatory Visit: Payer: Self-pay | Admitting: General Surgery

## 2016-09-27 DIAGNOSIS — N6091 Unspecified benign mammary dysplasia of right breast: Secondary | ICD-10-CM

## 2016-10-12 ENCOUNTER — Ambulatory Visit: Payer: Self-pay | Admitting: Orthopedic Surgery

## 2016-10-26 NOTE — Patient Instructions (Addendum)
Yolanda Henderson  10/26/2016   Your procedure is scheduled on: 11-01-16  [facility] to Admitting at 12:20 PM.    Call this number if you have problems the morning of surgery  [phone]   Remember: ONLY 1 PERSON MAY GO WITH YOU TO SHORT STAY TO GET  READY MORNING OF YOUR SURGERY.  Do not eat food or drink liquids :After Midnight. You may have a clear liquid diet from Midnight until 08:50 AM. After 08:50 AM, nothing by mouth      CLEAR LIQUID DIET   Foods Allowed                                                                     Foods Excluded  Coffee and tea, regular and decaf                             liquids that you cannot  Plain Jell-O in any flavor                                             see through such as: Fruit ices (not with fruit pulp)                                     milk, soups, orange juice  Iced Popsicles                                    All solid food Carbonated beverages, regular and diet                                    Cranberry, grape and apple juices Sports drinks like Gatorade Lightly seasoned clear broth or consume(fat free) Sugar, honey syrup  Sample Menu Breakfast                                Lunch                                     Supper Cranberry juice                    Beef broth                            Chicken broth Jell-O                                     Grape juice  Apple juice Coffee or tea                        Jell-O                                      Popsicle                                                Coffee or tea                        Coffee or tea  _____________________________________________________________________     Take these medicines the morning of surgery with A SIP OF WATER: None                                You may not have any metal on your body including hair pins and               piercings  Do not wear jewelry, make-up, lotions, powders or perfumes, deodorant             Do not wear nail polish.  Do not shave  48 hours prior to surgery.              Do not bring valuables to the hospital. [place] IS NOT             RESPONSIBLE   FOR VALUABLES.  Contacts, dentures or bridgework may not be worn into surgery.  Leave suitcase in the car. After surgery it may be brought to your room.                  Please read over the following fact sheets you were given: _____________________________________________________________________  [facility] - Preparing for Surgery Before surgery, you can play an important role.  Because skin is not sterile, your skin needs to be as free of germs as possible.  You can reduce the number of germs on your skin by washing with CHG (chlorahexidine gluconate) soap before surgery.  CHG is an antiseptic cleaner which kills germs and bonds with the skin to continue killing germs even after washing. Please DO NOT use if you have an allergy to CHG or antibacterial soaps.  If your skin becomes reddened/irritated stop using the CHG and inform your nurse when you arrive at Short Stay. Do not shave (including legs and underarms) for at least 48 hours prior to the first CHG shower.  You may shave your face/neck. Please follow these instructions carefully:  1.  Shower with CHG Soap the night before surgery and the  morning of Surgery.  2.  If you choose to wash your hair, wash your hair first as usual with your  normal  shampoo.  3.  After you shampoo, rinse your hair and body thoroughly to remove the  shampoo.                           4.  Use CHG as you would any other liquid soap.  You can apply chg directly  to the skin and wash  Gently with a scrungie or clean washcloth.  5.  Apply the CHG Soap to your body ONLY FROM THE NECK DOWN.   Do not use on face/ open                           Wound or open sores. Avoid contact with  eyes, ears mouth and genitals (private parts).                       Wash face,  Genitals (private parts) with your normal soap.             6.  Wash thoroughly, paying special attention to the area where your surgery  will be performed.  7.  Thoroughly rinse your body with warm water from the neck down.  8.  DO NOT shower/wash with your normal soap after using and rinsing off  the CHG Soap.                9.  Pat yourself dry with a clean towel.            10.  Wear clean pajamas.            11.  Place clean sheets on your bed the night of your first shower and do not  sleep with pets. Day of Surgery : Do not apply any lotions/deodorants the morning of surgery.  Please wear clean clothes to the hospital/surgery center.  FAILURE TO FOLLOW THESE INSTRUCTIONS MAY RESULT IN THE CANCELLATION OF YOUR SURGERY PATIENT SIGNATURE_________________________________  NURSE SIGNATURE__________________________________  ________________________________________________________________________   Adam Phenix  An incentive spirometer is a tool that can help keep your lungs clear and active. This tool measures how well you are filling your lungs with each breath. Taking long deep breaths may help reverse or decrease the chance of developing breathing (pulmonary) problems (especially infection) following:  A long period of time when you are unable to move or be active. BEFORE THE PROCEDURE   If the spirometer includes an indicator to show your best effort, your nurse or respiratory therapist will set it to a desired goal.  If possible, sit up straight or lean slightly forward. Try not to slouch.  Hold the incentive spirometer in an upright position. INSTRUCTIONS FOR USE  1. Sit on the edge of your bed if possible, or sit up as far as you can in bed or on a chair. 2. Hold the incentive spirometer in an upright position. 3. Breathe out normally. 4. Place the mouthpiece in your mouth and seal your  lips tightly around it. 5. Breathe in slowly and as deeply as possible, raising the piston or the ball toward the top of the column. 6. Hold your breath for 3-5 seconds or for as long as possible. Allow the piston or ball to fall to the bottom of the column. 7. Remove the mouthpiece from your mouth and breathe out normally. 8. Rest for a few seconds and repeat Steps 1 through 7 at least 10 times every 1-2 hours when you are awake. Take your time and take a few normal breaths between deep breaths. 9. The spirometer may include an indicator to show your best effort. Use the indicator as a goal to work toward during each repetition. 10. After each set of 10 deep breaths, practice coughing to be sure your lungs are clear. If you have an incision (the cut made at the time of  surgery), support your incision when coughing by placing a pillow or rolled up towels firmly against it. Once you are able to get out of bed, walk around indoors and cough well. You may stop using the incentive spirometer when instructed by your caregiver.  RISKS AND COMPLICATIONS  Take your time so you do not get dizzy or light-headed.  If you are in pain, you may need to take or ask for pain medication before doing incentive spirometry. It is harder to take a deep breath if you are having pain. AFTER USE  Rest and breathe slowly and easily.  It can be helpful to keep track of a log of your progress. Your caregiver can provide you with a simple table to help with this. If you are using the spirometer at home, follow these instructions: [place] IF:   You are having difficultly using the spirometer.  You have trouble using the spirometer as often as instructed.  Your pain medication is not giving enough relief while using the spirometer.  You develop fever of 100.5 F (38.1 C) or higher. SEEK IMMEDIATE MEDICAL CARE IF:   You cough up bloody sputum that had not been present before.  You develop fever of 102 F  (38.9 C) or greater.  You develop worsening pain at or near the incision site. MAKE SURE YOU:   Understand these instructions.  Will watch your condition.  Will get help right away if you are not doing well or get worse. Document Released: 09/20/2006 Document Revised: 08/02/2011 Document Reviewed: 11/21/2006 ExitCare Patient Information 2014 ExitCare, [place].   ________________________________________________________________________  WHAT IS A BLOOD TRANSFUSION? Blood Transfusion Information  A transfusion is the replacement of blood or some of its parts. Blood is made up of multiple cells which provide different functions.  Red blood cells carry oxygen and are used for blood loss replacement.  White blood cells fight against infection.  Platelets control bleeding.  Plasma helps clot blood.  Other blood products are available for specialized needs, such as hemophilia or other clotting disorders. BEFORE THE TRANSFUSION  Who gives blood for transfusions?   Healthy volunteers who are fully evaluated to make sure their blood is safe. This is blood bank blood. Transfusion therapy is the safest it has ever been in the practice of medicine. Before blood is taken from a donor, a complete history is taken to make sure that person has no history of diseases nor engages in risky social behavior (examples are intravenous drug use or sexual activity with multiple partners). The donor's travel history is screened to minimize risk of transmitting infections, such as malaria. The donated blood is tested for signs of infectious diseases, such as HIV and hepatitis. The blood is then tested to be sure it is compatible with you in order to minimize the chance of a transfusion reaction. If you or a relative donates blood, this is often done in anticipation of surgery and is not appropriate for emergency situations. It takes many days to process the donated blood. RISKS AND COMPLICATIONS Although  transfusion therapy is very safe and saves many lives, the main dangers of transfusion include:   Getting an infectious disease.  Developing a transfusion reaction. This is an allergic reaction to something in the blood you were given. Every precaution is taken to prevent this. The decision to have a blood transfusion has been considered carefully by your caregiver before blood is given. Blood is not given unless the benefits outweigh the risks. AFTER THE  TRANSFUSION  Right after receiving a blood transfusion, you will usually feel much better and more energetic. This is especially true if your red blood cells have gotten low (anemic). The transfusion raises the level of the red blood cells which carry oxygen, and this usually causes an energy increase.  The nurse administering the transfusion will monitor you carefully for complications. HOME CARE INSTRUCTIONS  No special instructions are needed after a transfusion. You may find your energy is better. Speak with your caregiver about any limitations on activity for underlying diseases you may have. SEEK MEDICAL CARE IF:   Your condition is not improving after your transfusion.  You develop redness or irritation at the intravenous (IV) site. SEEK IMMEDIATE MEDICAL CARE IF:  Any of the following symptoms occur over the next 12 hours:  Shaking chills.  You have a temperature by mouth above 102 F (38.9 C), not controlled by medicine.  Chest, back, or muscle pain.  People around you feel you are not acting correctly or are confused.  Shortness of breath or difficulty breathing.  Dizziness and fainting.  You get a rash or develop hives.  You have a decrease in urine output.  Your urine turns a dark color or changes to pink, red, or brown. Any of the following symptoms occur over the next 10 days:  You have a temperature by mouth above 102 F (38.9 C), not controlled by medicine.  Shortness of breath.  Weakness after normal  activity.  The white part of the eye turns yellow (jaundice).  You have a decrease in the amount of urine or are urinating less often.  Your urine turns a dark color or changes to pink, red, or brown. Document Released: 05/07/2000 Document Revised: 08/02/2011 Document Reviewed: 12/25/2007 [facility] Patient Information 2014 [place], [place].  _______________________________________________________________________

## 2016-10-27 ENCOUNTER — Encounter (HOSPITAL_COMMUNITY)
Admission: RE | Admit: 2016-10-27 | Discharge: 2016-10-27 | Disposition: A | Discharge status: Home / Self Care | Payer: BLUE CROSS/BLUE SHIELD | Source: Ambulatory Visit | Attending: Orthopedic Surgery | Admitting: Orthopedic Surgery

## 2016-10-27 ENCOUNTER — Encounter (HOSPITAL_COMMUNITY): Payer: Self-pay

## 2016-10-27 DIAGNOSIS — Z01818 Encounter for other preprocedural examination: Secondary | ICD-10-CM | POA: Diagnosis present

## 2016-10-27 DIAGNOSIS — M1712 Unilateral primary osteoarthritis, left knee: Secondary | ICD-10-CM | POA: Diagnosis not present

## 2016-10-27 LAB — CBC
HCT: 41.8 % (ref 36.0–46.0)
Hemoglobin: 13.7 g/dL (ref 12.0–15.0)
MCH: 29.8 pg (ref 26.0–34.0)
MCHC: 32.8 g/dL (ref 30.0–36.0)
MCV: 90.9 fL (ref 78.0–100.0)
PLATELETS: 274 10*3/uL (ref 150–400)
RBC: 4.6 MIL/uL (ref 3.87–5.11)
RDW: 12.9 % (ref 11.5–15.5)
WBC: 5.3 10*3/uL (ref 4.0–10.5)

## 2016-10-27 LAB — SURGICAL PCR SCREEN
MRSA, PCR: NEGATIVE
Staphylococcus aureus: NEGATIVE

## 2016-10-27 LAB — COMPREHENSIVE METABOLIC PANEL
ALT: 13 U/L — AB (ref 14–54)
AST: 16 U/L (ref 15–41)
Albumin: 3.9 g/dL (ref 3.5–5.0)
Alkaline Phosphatase: 81 U/L (ref 38–126)
Anion gap: 6 (ref 5–15)
BUN: 18 mg/dL (ref 6–20)
CHLORIDE: 108 mmol/L (ref 101–111)
CO2: 27 mmol/L (ref 22–32)
CREATININE: 0.74 mg/dL (ref 0.44–1.00)
Calcium: 8.8 mg/dL — ABNORMAL LOW (ref 8.9–10.3)
GFR calc non Af Amer: >60 mL/min (ref >60)
Glucose, Bld: 79 mg/dL (ref 65–99)
Potassium: 4.3 mmol/L (ref 3.5–5.1)
Sodium: 141 mmol/L (ref 135–145)
Total Bilirubin: 0.5 mg/dL (ref 0.3–1.2)
Total Protein: 7.1 g/dL (ref 6.5–8.1)

## 2016-10-27 LAB — PROTIME-INR
INR: 0.88
Prothrombin Time: 11.9 seconds (ref 11.4–15.2)

## 2016-10-27 LAB — APTT: aPTT: 28 seconds (ref 24–36)

## 2016-10-29 ENCOUNTER — Ambulatory Visit: Payer: Self-pay | Admitting: Orthopedic Surgery

## 2016-10-29 NOTE — H&P (Signed)
Yolanda Henderson DOB: 1961/03/29 Single / Language: [place] / Race: White Female Date of Admission:  11/01/2016 CC:  Left knee pain History of Present Illness  The patient is a 56 year old female who comes in for a preoperative History and Physical. The patient is scheduled for a left total knee arthroplasty to be performed by Dr. Gus Rankin. Aluisio, MD at [facility] on 11/01/2016. The patient is a 56 year old female who presented with knee complaints. The patient reports left knee symptoms including: pain, swelling, locking, catching, stiffness and soreness which began year(s) ago without any known injury. The patient feels that the symptoms are worsening. The patient has the current diagnosis of knee osteoarthritis. Prior to being seen, the patient was previously evaluated in this clinic. Previous work-up for this problem has included knee x-rays. Past treatment for this problem has included intra-articular injection of corticosteroids years ago. She states that she was actually scheduled for left total knee about four years ago, but the knee started feeling better after her right TKA. Risk factors include total knee replacement on the right 07/31/12. She states the right knee is still doing well. She feels as though the right total knee, which we did in March of 2014 is doing fantastic. She is not having any problems with that at all. Unfortunately, her left knee is getting progressively worse. It is hurting her at all times. She says as bad as the right one was before she had that fixed. It is now limiting what she can and cannot do. She does get swelling. It does give out on her. Has not locked up on her. She has had injections in the past without benefit. She is ready to proceed with surgery. They have been treated conservatively in the past for the above stated problem and despite conservative measures, they continue to have progressive pain and severe functional limitations and dysfunction.  They have failed non-operative management including home exercise, medications, and injections. It is felt that they would benefit from undergoing total joint replacement. Risks and benefits of the procedure have been discussed with the patient and they elect to proceed with surgery. There are no active contraindications to surgery such as ongoing infection or rapidly progressive neurological disease.   Problem List/Past Medical Knee pain (M25.569)  Status post total knee replacement, right  Primary osteoarthritis of left knee (M17.12)  Urinary Frequency  Seasonal Allergies   Allergies No Known Drug Allergies  Family History Drug / Alcohol Addiction  First Degree Relatives. sister and brother Osteoporosis  grandmother mothers side Hypertension  mother, grandfather mothers side and grandfather fathers side Cancer  grandfather mothers side, grandmother fathers side and grandfather fathers side Diabetes Mellitus  grandfather fathers side Heart Disease  mother and grandfather mothers side Heart disease in female family member before age 3  Congestive Heart Failure  mother, father and grandfather mothers side Cerebrovascular Accident  mother  Social History Tobacco use  Never smoker. never smoker Alcohol use  current drinker; drinks hard liquor; only occasionally per week Exercise  Exercises never Current work status  working full time Drug/Alcohol Rehab (Currently)  no Marital status  married Illicit drug use  no Living situation  live with spouse Copy of Drug/Alcohol Rehab (Previously)  no Children  1 Tobacco / smoke exposure  yes Number of flights of stairs before winded  1 Pain Contract  no  Medication History  Calcium + D (Oral) Specific strength unknown - Active. Glucosamine Chondroitin Complx (Oral)  Active. Ibuprofen (Oral as needed) Specific strength unknown - Active.   Past Surgical History  Resection of Stomach  Hysterectomy   partial (cancerous) Arthroscopy of Knee  left Total Knee Replacement - Right    Review of Systems  General Not Present- Chills, Fatigue, Fever, Memory Loss, Night Sweats, Weight Gain and Weight Loss. Skin Not Present- Eczema, Hives, Itching, Lesions and Rash. HEENT Not Present- Dentures, Double Vision, Headache, Hearing Loss, Tinnitus and Visual Loss. Respiratory Not Present- Allergies, Chronic Cough, Coughing up blood, Shortness of breath at rest and Shortness of breath with exertion. Cardiovascular Not Present- Chest Pain, Difficulty Breathing Lying Down, Murmur, Palpitations, Racing/skipping heartbeats and Swelling. Gastrointestinal Not Present- Abdominal Pain, Bloody Stool, Constipation, Diarrhea, Difficulty Swallowing, Heartburn, Jaundice, Loss of appetitie, Nausea and Vomiting. Female Genitourinary Not Present- Blood in Urine, Discharge, Flank Pain, Incontinence, Painful Urination, Urgency, Urinary frequency, Urinary Retention, Urinating at Night and Weak urinary stream. Musculoskeletal Present- Joint Pain. Not Present- Back Pain, Joint Swelling, Morning Stiffness, Muscle Pain, Muscle Weakness and Spasms. Neurological Not Present- Blackout spells, Difficulty with balance, Dizziness, Paralysis, Tremor and Weakness. Psychiatric Not Present- Insomnia.  Vitals  Weight: 207 lb Height: 62in Body Surface Area: 1.94 m Body Mass Index: 37.86 kg/m     Physical Exam General Mental Status -Alert, cooperative and good historian. General Appearance-pleasant, Not in acute distress. Orientation-Oriented X3. Build & Nutrition-Well nourished and Well developed.  Head and Neck Head-normocephalic, atraumatic . Neck Global Assessment - supple, no bruit auscultated on the right, no bruit auscultated on the left.  Eye Pupil - Bilateral-Regular and Round. Motion - Bilateral-EOMI.  Chest and Lung Exam Auscultation Breath sounds - clear at anterior chest wall and clear  at posterior chest wall. Adventitious sounds - No Adventitious sounds.  Cardiovascular Auscultation Rhythm - Regular rate and rhythm. Heart Sounds - S1 WNL and S2 WNL. Murmurs & Other Heart Sounds - Auscultation of the heart reveals - No Murmurs.  Abdomen Palpation/Percussion Tenderness - Abdomen is non-tender to palpation. Rigidity (guarding) - Abdomen is soft. Auscultation Auscultation of the abdomen reveals - Bowel sounds normal.  Female Genitourinary Note: Not done, not pertinent to present illness   Musculoskeletal Note: On exam, she is in no distress. Her right knee looks fantastic. Range 0 to 122 with no tenderness or instability. Left knee, no effusion, slight varus, range 5 to 120. Marked crepitus on range of motion. Tender medial greater than lateral, no instability.  Her radiographs, AP and lateral both knees show the prosthesis on the right in excellent position with no periprosthetic abnormalities. On the left, she has bone-on-bone arthritis in the medial and patellofemoral compartments with varus deformity.  Assessment & Plan  Primary osteoarthritis of left knee (M17.12) Status post total knee replacement, right  Note:Surgical Plans: Left Total Knee Replacement  Disposition: Home with help  PCP: Dr. Janey GreaserKendal - most recent physical April 2018  IV TXA  Anesthesia Issues: None  Patient was instructed on what medications to stop prior to surgery.  Signed electronically by Lauraine RinneAlexzandrew L Perkins, III PA-

## 2016-11-01 ENCOUNTER — Inpatient Hospital Stay (HOSPITAL_COMMUNITY): Payer: BLUE CROSS/BLUE SHIELD | Admitting: Anesthesiology

## 2016-11-01 ENCOUNTER — Inpatient Hospital Stay (HOSPITAL_COMMUNITY)
Admission: RE | Admit: 2016-11-01 | Discharge: 2016-11-03 | DRG: 470 | Disposition: A | Discharge status: Home / Self Care | Payer: BLUE CROSS/BLUE SHIELD | Source: Ambulatory Visit | Attending: Orthopedic Surgery | Admitting: Orthopedic Surgery

## 2016-11-01 ENCOUNTER — Encounter (HOSPITAL_COMMUNITY): Payer: Self-pay

## 2016-11-01 ENCOUNTER — Encounter (HOSPITAL_COMMUNITY)
Admission: RE | Disposition: A | Discharge status: Home / Self Care | Payer: Self-pay | Source: Ambulatory Visit | Attending: Orthopedic Surgery

## 2016-11-01 DIAGNOSIS — Z8619 Personal history of other infectious and parasitic diseases: Secondary | ICD-10-CM | POA: Diagnosis not present

## 2016-11-01 DIAGNOSIS — Z96653 Presence of artificial knee joint, bilateral: Secondary | ICD-10-CM | POA: Diagnosis present

## 2016-11-01 DIAGNOSIS — M179 Osteoarthritis of knee, unspecified: Secondary | ICD-10-CM | POA: Diagnosis present

## 2016-11-01 DIAGNOSIS — E669 Obesity, unspecified: Secondary | ICD-10-CM | POA: Diagnosis present

## 2016-11-01 DIAGNOSIS — M1712 Unilateral primary osteoarthritis, left knee: Principal | ICD-10-CM | POA: Diagnosis present

## 2016-11-01 DIAGNOSIS — K219 Gastro-esophageal reflux disease without esophagitis: Secondary | ICD-10-CM | POA: Diagnosis present

## 2016-11-01 DIAGNOSIS — Z79899 Other long term (current) drug therapy: Secondary | ICD-10-CM | POA: Diagnosis not present

## 2016-11-01 DIAGNOSIS — Z6838 Body mass index (BMI) 38.0-38.9, adult: Secondary | ICD-10-CM | POA: Diagnosis not present

## 2016-11-01 DIAGNOSIS — M1710 Unilateral primary osteoarthritis, unspecified knee: Secondary | ICD-10-CM | POA: Diagnosis present

## 2016-11-01 DIAGNOSIS — N3281 Overactive bladder: Secondary | ICD-10-CM | POA: Diagnosis present

## 2016-11-01 HISTORY — PX: TOTAL KNEE ARTHROPLASTY: SHX125

## 2016-11-01 LAB — CBC WITH DIFFERENTIAL/PLATELET
BASOS ABS: 0 10*3/uL (ref 0.0–0.1)
Basophils Relative: 0 %
EOS PCT: 1 %
Eosinophils Absolute: 0.1 10*3/uL (ref 0.0–0.7)
HCT: 41.5 % (ref 36.0–46.0)
Hemoglobin: 13.8 g/dL (ref 12.0–15.0)
LYMPHS ABS: 1.4 10*3/uL (ref 0.7–4.0)
Lymphocytes Relative: 11 %
MCH: 30.5 pg (ref 26.0–34.0)
MCHC: 33.3 g/dL (ref 30.0–36.0)
MCV: 91.6 fL (ref 78.0–100.0)
Monocytes Absolute: 0.3 10*3/uL (ref 0.1–1.0)
Monocytes Relative: 2 %
NEUTROS PCT: 86 %
Neutro Abs: 11.2 10*3/uL — ABNORMAL HIGH (ref 1.7–7.7)
Platelets: 273 10*3/uL (ref 150–400)
RBC: 4.53 MIL/uL (ref 3.87–5.11)
RDW: 13 % (ref 11.5–15.5)
WBC: 13.1 10*3/uL — AB (ref 4.0–10.5)

## 2016-11-01 LAB — COMPREHENSIVE METABOLIC PANEL
ALK PHOS: 80 U/L (ref 38–126)
ALT: 15 U/L (ref 14–54)
AST: 19 U/L (ref 15–41)
Albumin: 4.1 g/dL (ref 3.5–5.0)
Anion gap: 4 — ABNORMAL LOW (ref 5–15)
BUN: 13 mg/dL (ref 6–20)
CALCIUM: 8.8 mg/dL — AB (ref 8.9–10.3)
CO2: 29 mmol/L (ref 22–32)
CREATININE: 0.77 mg/dL (ref 0.44–1.00)
Chloride: 108 mmol/L (ref 101–111)
Glucose, Bld: 118 mg/dL — ABNORMAL HIGH (ref 65–99)
Potassium: 3.9 mmol/L (ref 3.5–5.1)
Sodium: 141 mmol/L (ref 135–145)
Total Bilirubin: 0.5 mg/dL (ref 0.3–1.2)
Total Protein: 7.3 g/dL (ref 6.5–8.1)

## 2016-11-01 LAB — TYPE AND SCREEN
ABO/RH(D): O POS
Antibody Screen: NEGATIVE

## 2016-11-01 SURGERY — ARTHROPLASTY, KNEE, TOTAL
Anesthesia: Spinal | Site: Knee | Laterality: Left

## 2016-11-01 MED ORDER — BISACODYL 10 MG RE SUPP: 10.0000 mg | Freq: Every day | RECTAL | Status: DC | PRN

## 2016-11-01 MED ORDER — PROPOFOL 10 MG/ML IV BOLUS
INTRAVENOUS | Status: AC
  Filled 2016-11-01: qty 60

## 2016-11-01 MED ORDER — ACETAMINOPHEN 10 MG/ML IV SOLN
1000.0000 mg | Freq: Once | INTRAVENOUS | Status: AC
  Administered 2016-11-01: 1000 mg via INTRAVENOUS

## 2016-11-01 MED ORDER — EPHEDRINE SULFATE 50 MG/ML IJ SOLN
INTRAMUSCULAR | Status: DC | PRN
  Administered 2016-11-01 (×3): 10 mg via INTRAVENOUS

## 2016-11-01 MED ORDER — CEFAZOLIN SODIUM-DEXTROSE 2-4 GM/100ML-% IV SOLN
2.0000 g | Freq: Four times a day (QID) | INTRAVENOUS | Status: AC
  Administered 2016-11-01 – 2016-11-02 (×2): 2 g via INTRAVENOUS
  Filled 2016-11-01 (×2): qty 100

## 2016-11-01 MED ORDER — FLEET ENEMA 7-19 GM/118ML RE ENEM: 1.0000 | ENEMA | Freq: Once | RECTAL | Status: DC | PRN

## 2016-11-01 MED ORDER — ONDANSETRON HCL 4 MG PO TABS
4.0000 mg | ORAL_TABLET | Freq: Four times a day (QID) | ORAL | Status: DC | PRN
  Administered 2016-11-03: 11:00:00 4 mg via ORAL
  Filled 2016-11-01: qty 1

## 2016-11-01 MED ORDER — TRANEXAMIC ACID 1000 MG/10ML IV SOLN
1000.0000 mg | INTRAVENOUS | Status: AC
  Administered 2016-11-01: 1000 mg via INTRAVENOUS
  Filled 2016-11-01: qty 1100

## 2016-11-01 MED ORDER — LACTATED RINGERS IV SOLN
INTRAVENOUS | Status: DC
  Administered 2016-11-01: 16:00:00 via INTRAVENOUS
  Administered 2016-11-01: 1000 mL via INTRAVENOUS

## 2016-11-01 MED ORDER — MIDAZOLAM HCL 2 MG/2ML IJ SOLN
INTRAMUSCULAR | Status: AC
  Administered 2016-11-01: 2 mg
  Filled 2016-11-01: qty 2

## 2016-11-01 MED ORDER — POLYETHYLENE GLYCOL 3350 17 G PO PACK: 17.0000 g | PACK | Freq: Every day | ORAL | Status: DC | PRN

## 2016-11-01 MED ORDER — PHENOL 1.4 % MT LIQD: 1.0000 | OROMUCOSAL | Status: DC | PRN

## 2016-11-01 MED ORDER — METHOCARBAMOL 500 MG PO TABS
500.0000 mg | ORAL_TABLET | Freq: Four times a day (QID) | ORAL | Status: DC | PRN
  Administered 2016-11-02 – 2016-11-03 (×2): 500 mg via ORAL
  Filled 2016-11-01 (×2): qty 1

## 2016-11-01 MED ORDER — ROPIVACAINE HCL 5 MG/ML IJ SOLN
INTRAMUSCULAR | Status: DC | PRN
  Administered 2016-11-01: 30 mL via PERINEURAL

## 2016-11-01 MED ORDER — TRAMADOL HCL 50 MG PO TABS: 50.0000 mg | ORAL_TABLET | Freq: Four times a day (QID) | ORAL | Status: DC | PRN

## 2016-11-01 MED ORDER — RIVAROXABAN 10 MG PO TABS
10.0000 mg | ORAL_TABLET | Freq: Every day | ORAL | Status: DC
  Administered 2016-11-02 – 2016-11-03 (×2): 10 mg via ORAL
  Filled 2016-11-01 (×2): qty 1

## 2016-11-01 MED ORDER — BUPIVACAINE LIPOSOME 1.3 % IJ SUSP
20.0000 mL | Freq: Once | INTRAMUSCULAR | Status: DC
  Filled 2016-11-01: qty 20

## 2016-11-01 MED ORDER — HYDROMORPHONE HCL 1 MG/ML IJ SOLN: 0.2500 mg | INTRAMUSCULAR | Status: DC | PRN

## 2016-11-01 MED ORDER — PROMETHAZINE HCL 25 MG/ML IJ SOLN: 6.2500 mg | INTRAMUSCULAR | Status: DC | PRN

## 2016-11-01 MED ORDER — GABAPENTIN 300 MG PO CAPS
ORAL_CAPSULE | ORAL | Status: AC
  Administered 2016-11-01: 300 mg via ORAL
  Filled 2016-11-01: qty 1

## 2016-11-01 MED ORDER — DEXAMETHASONE SODIUM PHOSPHATE 10 MG/ML IJ SOLN
10.0000 mg | Freq: Once | INTRAMUSCULAR | Status: AC
  Administered 2016-11-02: 10 mg via INTRAVENOUS
  Filled 2016-11-01: qty 1

## 2016-11-01 MED ORDER — ACETAMINOPHEN 650 MG RE SUPP: 650.0000 mg | Freq: Four times a day (QID) | RECTAL | Status: DC | PRN

## 2016-11-01 MED ORDER — 0.9 % SODIUM CHLORIDE (POUR BTL) OPTIME
TOPICAL | Status: DC | PRN
  Administered 2016-11-01: 1000 mL

## 2016-11-01 MED ORDER — SODIUM CHLORIDE 0.9 % IJ SOLN
INTRAMUSCULAR | Status: AC
  Filled 2016-11-01: qty 50

## 2016-11-01 MED ORDER — ONDANSETRON HCL 4 MG/2ML IJ SOLN
INTRAMUSCULAR | Status: DC | PRN
  Administered 2016-11-01: 4 mg via INTRAVENOUS

## 2016-11-01 MED ORDER — CHLORHEXIDINE GLUCONATE CLOTH 2 % EX PADS: 6.0000 | MEDICATED_PAD | Freq: Once | CUTANEOUS | Status: DC

## 2016-11-01 MED ORDER — ONDANSETRON HCL 4 MG/2ML IJ SOLN
INTRAMUSCULAR | Status: AC
  Filled 2016-11-01: qty 2

## 2016-11-01 MED ORDER — ACETAMINOPHEN 500 MG PO TABS
1000.0000 mg | ORAL_TABLET | Freq: Four times a day (QID) | ORAL | Status: AC
  Administered 2016-11-01 – 2016-11-02 (×4): 1000 mg via ORAL
  Filled 2016-11-01 (×4): qty 2

## 2016-11-01 MED ORDER — DEXAMETHASONE SODIUM PHOSPHATE 10 MG/ML IJ SOLN
INTRAMUSCULAR | Status: AC
  Filled 2016-11-01: qty 1

## 2016-11-01 MED ORDER — ACETAMINOPHEN 500 MG PO TABS: 1000.0000 mg | ORAL_TABLET | ORAL | Status: DC

## 2016-11-01 MED ORDER — METOCLOPRAMIDE HCL 5 MG PO TABS: 5.0000 mg | ORAL_TABLET | Freq: Three times a day (TID) | ORAL | Status: DC | PRN

## 2016-11-01 MED ORDER — LIDOCAINE 2% (20 MG/ML) 5 ML SYRINGE
INTRAMUSCULAR | Status: AC
  Filled 2016-11-01: qty 5

## 2016-11-01 MED ORDER — CELECOXIB 200 MG PO CAPS
400.0000 mg | ORAL_CAPSULE | ORAL | Status: AC
  Administered 2016-11-01: 400 mg via ORAL

## 2016-11-01 MED ORDER — MORPHINE SULFATE (PF) 2 MG/ML IV SOLN: 1.0000 mg | INTRAVENOUS | Status: DC | PRN

## 2016-11-01 MED ORDER — MEPERIDINE HCL 50 MG/ML IJ SOLN: 6.2500 mg | INTRAMUSCULAR | Status: DC | PRN

## 2016-11-01 MED ORDER — DOCUSATE SODIUM 100 MG PO CAPS
100.0000 mg | ORAL_CAPSULE | Freq: Two times a day (BID) | ORAL | Status: DC
  Administered 2016-11-01 – 2016-11-03 (×4): 100 mg via ORAL
  Filled 2016-11-01 (×4): qty 1

## 2016-11-01 MED ORDER — LIDOCAINE 2% (20 MG/ML) 5 ML SYRINGE
INTRAMUSCULAR | Status: DC | PRN
  Administered 2016-11-01: 60 mg via INTRAVENOUS

## 2016-11-01 MED ORDER — METHOCARBAMOL 1000 MG/10ML IJ SOLN
500.0000 mg | Freq: Four times a day (QID) | INTRAVENOUS | Status: DC | PRN
  Filled 2016-11-01: qty 5

## 2016-11-01 MED ORDER — SODIUM CHLORIDE 0.9 % IV SOLN
INTRAVENOUS | Status: DC
  Administered 2016-11-01: 20:00:00 via INTRAVENOUS

## 2016-11-01 MED ORDER — METOCLOPRAMIDE HCL 5 MG/ML IJ SOLN: 5.0000 mg | Freq: Three times a day (TID) | INTRAMUSCULAR | Status: DC | PRN

## 2016-11-01 MED ORDER — PROPOFOL 500 MG/50ML IV EMUL
INTRAVENOUS | Status: DC | PRN
  Administered 2016-11-01: 50 ug/kg/min via INTRAVENOUS

## 2016-11-01 MED ORDER — EPHEDRINE 5 MG/ML INJ
INTRAVENOUS | Status: AC
  Filled 2016-11-01: qty 10

## 2016-11-01 MED ORDER — PROPOFOL 10 MG/ML IV BOLUS
INTRAVENOUS | Status: DC | PRN
  Administered 2016-11-01 (×2): 20 mg via INTRAVENOUS

## 2016-11-01 MED ORDER — DIPHENHYDRAMINE HCL 12.5 MG/5ML PO ELIX: 12.5000 mg | ORAL_SOLUTION | ORAL | Status: DC | PRN

## 2016-11-01 MED ORDER — BUPIVACAINE IN DEXTROSE 0.75-8.25 % IT SOLN
INTRATHECAL | Status: DC | PRN
  Administered 2016-11-01: 15 mg via INTRATHECAL

## 2016-11-01 MED ORDER — TRANEXAMIC ACID 1000 MG/10ML IV SOLN
1000.0000 mg | Freq: Once | INTRAVENOUS | Status: AC
  Administered 2016-11-01: 1000 mg via INTRAVENOUS
  Filled 2016-11-01: qty 1100

## 2016-11-01 MED ORDER — OXYCODONE HCL 5 MG PO TABS
5.0000 mg | ORAL_TABLET | ORAL | Status: DC | PRN
  Administered 2016-11-02 – 2016-11-03 (×9): 5 mg via ORAL
  Filled 2016-11-01 (×6): qty 1
  Filled 2016-11-01: qty 2
  Filled 2016-11-01 (×2): qty 1

## 2016-11-01 MED ORDER — SODIUM CHLORIDE 0.9 % IJ SOLN
INTRAMUSCULAR | Status: AC
  Filled 2016-11-01: qty 10

## 2016-11-01 MED ORDER — CEFAZOLIN SODIUM-DEXTROSE 2-4 GM/100ML-% IV SOLN
INTRAVENOUS | Status: AC
  Filled 2016-11-01: qty 100

## 2016-11-01 MED ORDER — DEXAMETHASONE SODIUM PHOSPHATE 10 MG/ML IJ SOLN
10.0000 mg | Freq: Once | INTRAMUSCULAR | Status: AC
  Administered 2016-11-01: 10 mg via INTRAVENOUS

## 2016-11-01 MED ORDER — CEFAZOLIN SODIUM-DEXTROSE 2-4 GM/100ML-% IV SOLN
2.0000 g | INTRAVENOUS | Status: AC
  Administered 2016-11-01: 2 g via INTRAVENOUS

## 2016-11-01 MED ORDER — STERILE WATER FOR IRRIGATION IR SOLN
Status: DC | PRN
  Administered 2016-11-01: 2000 mL

## 2016-11-01 MED ORDER — MENTHOL 3 MG MT LOZG: 1.0000 | LOZENGE | OROMUCOSAL | Status: DC | PRN

## 2016-11-01 MED ORDER — BUPIVACAINE LIPOSOME 1.3 % IJ SUSP
INTRAMUSCULAR | Status: DC | PRN
  Administered 2016-11-01: 20 mL

## 2016-11-01 MED ORDER — SODIUM CHLORIDE 0.9 % IJ SOLN
INTRAMUSCULAR | Status: DC | PRN
  Administered 2016-11-01: 60 mL

## 2016-11-01 MED ORDER — FENTANYL CITRATE (PF) 100 MCG/2ML IJ SOLN
INTRAMUSCULAR | Status: AC
  Administered 2016-11-01: 100 ug
  Filled 2016-11-01: qty 2

## 2016-11-01 MED ORDER — MIDAZOLAM HCL 2 MG/2ML IJ SOLN: 0.5000 mg | Freq: Once | INTRAMUSCULAR | Status: DC | PRN

## 2016-11-01 MED ORDER — CELECOXIB 200 MG PO CAPS
ORAL_CAPSULE | ORAL | Status: AC
  Administered 2016-11-01: 400 mg via ORAL
  Filled 2016-11-01: qty 2

## 2016-11-01 MED ORDER — ACETAMINOPHEN 10 MG/ML IV SOLN
INTRAVENOUS | Status: AC
  Filled 2016-11-01: qty 100

## 2016-11-01 MED ORDER — ACETAMINOPHEN 325 MG PO TABS
650.0000 mg | ORAL_TABLET | Freq: Four times a day (QID) | ORAL | Status: DC | PRN
  Administered 2016-11-02: 650 mg via ORAL
  Filled 2016-11-01: qty 2

## 2016-11-01 MED ORDER — ONDANSETRON HCL 4 MG/2ML IJ SOLN: 4.0000 mg | Freq: Four times a day (QID) | INTRAMUSCULAR | Status: DC | PRN

## 2016-11-01 MED ORDER — GABAPENTIN 300 MG PO CAPS
300.0000 mg | ORAL_CAPSULE | Freq: Once | ORAL | Status: AC
  Administered 2016-11-01: 300 mg via ORAL

## 2016-11-01 MED ORDER — GABAPENTIN 300 MG PO CAPS: 300.0000 mg | ORAL_CAPSULE | ORAL | Status: DC

## 2016-11-01 SURGICAL SUPPLY — 50 items
BAG DECANTER FOR FLEXI CONT (MISCELLANEOUS) IMPLANT
BAG ZIPLOCK 12X15 (MISCELLANEOUS) ×3 IMPLANT
BANDAGE ACE 6X5 VEL STRL LF (GAUZE/BANDAGES/DRESSINGS) ×3 IMPLANT
BLADE SAG 18X100X1.27 (BLADE) ×3 IMPLANT
BLADE SAW SGTL 11.0X1.19X90.0M (BLADE) ×3 IMPLANT
BOWL SMART MIX CTS (DISPOSABLE) ×3 IMPLANT
CAPT KNEE TOTAL 3 ATTUNE ×3 IMPLANT
CEMENT HV SMART SET (Cement) ×6 IMPLANT
CLOSURE WOUND 1/2 X4 (GAUZE/BANDAGES/DRESSINGS) ×2
COVER SURGICAL LIGHT HANDLE (MISCELLANEOUS) ×3 IMPLANT
CUFF TOURN SGL QUICK 34 (TOURNIQUET CUFF) ×2
CUFF TRNQT CYL 34X4X40X1 (TOURNIQUET CUFF) ×1 IMPLANT
DECANTER SPIKE VIAL GLASS SM (MISCELLANEOUS) ×3 IMPLANT
DRAPE U-SHAPE 47X51 STRL (DRAPES) ×3 IMPLANT
DRSG ADAPTIC 3X8 NADH LF (GAUZE/BANDAGES/DRESSINGS) ×3 IMPLANT
DRSG PAD ABDOMINAL 8X10 ST (GAUZE/BANDAGES/DRESSINGS) ×3 IMPLANT
DURAPREP 26ML APPLICATOR (WOUND CARE) ×3 IMPLANT
ELECT REM PT RETURN 15FT ADLT (MISCELLANEOUS) ×3 IMPLANT
EVACUATOR 1/8 PVC DRAIN (DRAIN) ×3 IMPLANT
GAUZE SPONGE 4X4 12PLY STRL (GAUZE/BANDAGES/DRESSINGS) ×3 IMPLANT
GLOVE BIO SURGEON STRL SZ7.5 (GLOVE) ×3 IMPLANT
GLOVE BIO SURGEON STRL SZ8 (GLOVE) ×3 IMPLANT
GLOVE BIOGEL PI IND STRL 6.5 (GLOVE) IMPLANT
GLOVE BIOGEL PI IND STRL 8 (GLOVE) ×2 IMPLANT
GLOVE BIOGEL PI INDICATOR 6.5 (GLOVE)
GLOVE BIOGEL PI INDICATOR 8 (GLOVE) ×4
GLOVE SURG SS PI 6.5 STRL IVOR (GLOVE) IMPLANT
GOWN STRL REUS W/TWL LRG LVL3 (GOWN DISPOSABLE) ×3 IMPLANT
GOWN STRL REUS W/TWL XL LVL3 (GOWN DISPOSABLE) ×3 IMPLANT
HANDPIECE INTERPULSE COAX TIP (DISPOSABLE) ×2
IMMOBILIZER KNEE 20 (SOFTGOODS) ×3
IMMOBILIZER KNEE 20 THIGH 36 (SOFTGOODS) ×1 IMPLANT
MANIFOLD NEPTUNE II (INSTRUMENTS) ×3 IMPLANT
NS IRRIG 1000ML POUR BTL (IV SOLUTION) ×3 IMPLANT
PACK TOTAL KNEE CUSTOM (KITS) ×3 IMPLANT
PADDING CAST COTTON 6X4 STRL (CAST SUPPLIES) ×6 IMPLANT
POSITIONER SURGICAL ARM (MISCELLANEOUS) ×3 IMPLANT
SET HNDPC FAN SPRY TIP SCT (DISPOSABLE) ×1 IMPLANT
STRIP CLOSURE SKIN 1/2X4 (GAUZE/BANDAGES/DRESSINGS) ×4 IMPLANT
SUT MNCRL AB 4-0 PS2 18 (SUTURE) ×3 IMPLANT
SUT STRATAFIX 0 PDS 27 VIOLET (SUTURE) ×3
SUT VIC AB 2-0 CT1 27 (SUTURE) ×8
SUT VIC AB 2-0 CT1 TAPERPNT 27 (SUTURE) ×4 IMPLANT
SUTURE STRATFX 0 PDS 27 VIOLET (SUTURE) ×1 IMPLANT
SYR 50ML LL SCALE MARK (SYRINGE) IMPLANT
TRAY FOLEY CATH 14FRSI W/METER (CATHETERS) ×3 IMPLANT
TRAY FOLEY W/METER SILVER 16FR (SET/KITS/TRAYS/PACK) IMPLANT
WATER STERILE IRR 1000ML POUR (IV SOLUTION) ×6 IMPLANT
WRAP KNEE MAXI GEL POST OP (GAUZE/BANDAGES/DRESSINGS) ×3 IMPLANT
YANKAUER SUCT BULB TIP 10FT TU (MISCELLANEOUS) ×3 IMPLANT

## 2016-11-01 NOTE — Transfer of Care (Signed)
Immediate Anesthesia Transfer of Care Note  Patient: Yolanda Henderson  Procedure(s) Performed: Procedure(s) with comments: LEFT TOTAL KNEE ARTHROPLASTY (Left) - with abductor block  Patient Location: PACU  Anesthesia Type:MAC, Regional and Spinal  Level of Consciousness: awake, alert , oriented and patient cooperative  Airway & Oxygen Therapy: Patient Spontanous Breathing and Patient connected to face mask oxygen  Post-op Assessment: Report given to RN and Post -op Vital signs reviewed and stable  Post vital signs: Reviewed and stable  Last Vitals:  Vitals:   11/01/16 1432 11/01/16 1433  BP:  131/80  Pulse: (!) 56 (!) 50  Resp: 13 14    Last Pain:  Vitals:   11/01/16 1317  PainSc: 1       Patients Stated Pain Goal: 3 (83/15/17 6160)  Complications: No apparent anesthesia complications

## 2016-11-01 NOTE — Anesthesia Postprocedure Evaluation (Signed)
Anesthesia Post Note  Patient: Wonda AmisFelicia Kay Demas  Procedure(s) Performed: Procedure(s) (LRB): LEFT TOTAL KNEE ARTHROPLASTY (Left)     Patient location during evaluation: PACU Anesthesia Type: Spinal Level of consciousness: awake and alert, patient cooperative and oriented Pain management: pain level controlled Vital Signs Assessment: post-procedure vital signs reviewed and stable Respiratory status: spontaneous breathing, nonlabored ventilation, respiratory function stable and patient connected to nasal cannula oxygen Cardiovascular status: blood pressure returned to baseline and stable Postop Assessment: spinal receding Anesthetic complications: no    Last Vitals:  Vitals:   11/01/16 1847 11/01/16 2000  BP: 101/70 128/77  Pulse: 83 89  Resp: 18 16  Temp: 36.5 C 36.9 C    Last Pain:  Vitals:   11/01/16 2000  TempSrc: Oral  PainSc: 0-No pain                 Falcon Mccaskey,E. Oma Alpert

## 2016-11-01 NOTE — Op Note (Signed)
OPERATIVE REPORT-TOTAL KNEE ARTHROPLASTY   Pre-operative diagnosis- Osteoarthritis  Left knee(s)  Post-operative diagnosis- Osteoarthritis Left knee(s)  Procedure-  Left  Total Knee Arthroplasty  Surgeon- Gus Rankin. Allyn Bartelson, MD  Assistant- Avel Peace, Pa-C   Anesthesia-  Adductor canal block and spinal  EBL-* No blood loss amount entered *   Drains Hemovac  Tourniquet time-  Total Tourniquet Time Documented: Thigh (Left) - 45 minutes Total: Thigh (Left) - 45 minutes     Complications- None  Condition-PACU - hemodynamically stable.   Brief Clinical Note  Yolanda Henderson is a 56 y.o. year old female with end stage OA of her left knee with progressively worsening pain and dysfunction. She has constant pain, with activity and at rest and significant functional deficits with difficulties even with ADLs. She has had extensive non-op management including analgesics, injections of cortisone and viscosupplements, and home exercise program, but remains in significant pain with significant dysfunction. Radiographs show bone on bone arthritis medial and patellofemoral. She presents now for left Total Knee Arthroplasty.    Procedure in detail---   The patient is brought into the operating room and positioned supine on the operating table. After successful administration of  Adductor canal block and spinal,   a tourniquet is placed high on the  Left thigh(s) and the lower extremity is prepped and draped in the usual sterile fashion. Time out is performed by the operating team and then the  Left lower extremity is wrapped in Esmarch, knee flexed and the tourniquet inflated to 300 mmHg.       A midline incision is made with a ten blade through the subcutaneous tissue to the level of the extensor mechanism. A fresh blade is used to make a medial parapatellar arthrotomy. Soft tissue over the proximal medial tibia is subperiosteally elevated to the joint line with a knife and into the  semimembranosus bursa with a Cobb elevator. Soft tissue over the proximal lateral tibia is elevated with attention being paid to avoiding the patellar tendon on the tibial tubercle. The patella is everted, knee flexed 90 degrees and the ACL and PCL are removed. Findings are bone on bone medial and patellofemoral with large global osteophytes.        The drill is used to create a starting hole in the distal femur and the canal is thoroughly irrigated with sterile saline to remove the fatty contents. The 5 degree Left  valgus alignment guide is placed into the femoral canal and the distal femoral cutting block is pinned to remove 9 mm off the distal femur. Resection is made with an oscillating saw.      The tibia is subluxed forward and the menisci are removed. The extramedullary alignment guide is placed referencing proximally at the medial aspect of the tibial tubercle and distally along the second metatarsal axis and tibial crest. The block is pinned to remove 2mm off the more deficient medial  side. Resection is made with an oscillating saw. Size 4is the most appropriate size for the tibia and the proximal tibia is prepared with the modular drill and keel punch for that size.      The femoral sizing guide is placed and size 5 is most appropriate. Rotation is marked off the epicondylar axis and confirmed by creating a rectangular flexion gap at 90 degrees. The size 5 cutting block is pinned in this rotation and the anterior, posterior and chamfer cuts are made with the oscillating saw. The intercondylar block is then placed and  that cut is made.      Trial size 4 tibial component, trial size 5 narrow posterior stabilized femur and a 8  mm posterior stabilized rotating platform insert trial is placed. Full extension is achieved with excellent varus/valgus and anterior/posterior balance throughout full range of motion. The patella is everted and thickness measured to be 22  mm. Free hand resection is taken to 12  mm, a 35 template is placed, lug holes are drilled, trial patella is placed, and it tracks normally. Osteophytes are removed off the posterior femur with the trial in place. All trials are removed and the cut bone surfaces prepared with pulsatile lavage. Cement is mixed and once ready for implantation, the size 4 tibial implant, size  5 narrow posterior stabilized femoral component, and the size 35 patella are cemented in place and the patella is held with the clamp. The trial insert is placed and the knee held in full extension. The Exparel (20 ml mixed with 60 ml saline) is injected into the extensor mechanism, posterior capsule, medial and lateral gutters and subcutaneous tissues.  All extruded cement is removed and once the cement is hard the permanent 8 mm posterior stabilized rotating platform insert is placed into the tibial tray.      The wound is copiously irrigated with saline solution and the extensor mechanism closed over a hemovac drain with #1 V-loc suture. The tourniquet is released for a total tourniquet time of 45  minutes. Flexion against gravity is 130 degrees and the patella tracks normally. Subcutaneous tissue is closed with 2.0 vicryl and subcuticular with running 4.0 Monocryl. The incision is cleaned and dried and steri-strips and a bulky sterile dressing are applied. The limb is placed into a knee immobilizer and the patient is awakened and transported to recovery in stable condition.      Please note that a surgical assistant was a medical necessity for this procedure in order to perform it in a safe and expeditious manner. Surgical assistant was necessary to retract the ligaments and vital neurovascular structures to prevent injury to them and also necessary for proper positioning of the limb to allow for anatomic placement of the prosthesis.   Gus RankinFrank V. Yancy Hascall, MD    11/01/2016, 4:26 PM

## 2016-11-01 NOTE — Progress Notes (Signed)
AssistedDr. Jairo Benarswell [name] with AssistedDr. Jairo Benarswell [name] with left, ultrasound guided, adductor canal block. Side rails up, monitors on throughout procedure. See vital signs in flow sheet. Tolerated Procedure well.  block. Side rails up, monitors on throughout procedure. See vital signs in flow sheet. Tolerated Procedure well.

## 2016-11-01 NOTE — Anesthesia Procedure Notes (Signed)
Anesthesia Regional Block: Adductor canal block   Pre-Anesthetic Checklist: ,, timeout performed, Correct Patient, Correct Site, Correct Laterality, Correct Procedure, Correct Position, site marked, Risks and benefits discussed,  Surgical consent,  Pre-op evaluation,  At surgeon's request and post-op pain management  Laterality: Left and Lower  Prep: chloraprep       Needles:  Injection technique: Single-shot  Needle Type: Echogenic Needle     Needle Length: 9cm  Needle Gauge: 21     Additional Needles:   Procedures: ultrasound guided,,,,,,,,  Narrative:  Start time: 11/01/2016 2:07 PM End time: 11/01/2016 2:14 PM Injection made incrementally with aspirations every 5 mL.  Performed by: Personally  Anesthesiologist: Jean RosenthalJACKSON, Letonya Mangels  Additional Notes: Pt identified in Holding room.  Monitors applied. Working IV access confirmed. Sterile prep L thigh.  #21ga ECHOgenic needle into adductor canal with US guidance.  30cc 0.5% Ropivacaine injected incrementally after negative test dose.  Patient asymptomatic, VSS, no heme aspirated, tolerated well.  Sandford Craze Kanyon Bunn, MD

## 2016-11-01 NOTE — Anesthesia Preprocedure Evaluation (Addendum)
Anesthesia Evaluation  Patient identified by MRN, date of birth, ID band Patient awake    Reviewed: Allergy & Precautions, NPO status , Patient's Chart, lab work & pertinent test results  History of Anesthesia Complications Negative for: history of anesthetic complications  Airway Mallampati: II  TM Distance: >3 FB Neck ROM: Full    Dental  (+) Dental Advisory Given   Pulmonary neg pulmonary ROS,    breath sounds clear to auscultation       Cardiovascular (-) anginanegative cardio ROS   Rhythm:Regular Rate:Normal     Neuro/Psych negative neurological ROS     GI/Hepatic Neg liver ROS, GERD  Controlled and Medicated,  Endo/Other  Morbid obesity  Renal/GU negative Renal ROS     Musculoskeletal  (+) Arthritis , Osteoarthritis,    Abdominal (+) + obese,   Peds  Hematology negative hematology ROS (+)   Anesthesia Other Findings   Reproductive/Obstetrics                            Anesthesia Physical Anesthesia Plan  ASA: II  Anesthesia Plan: Spinal   Post-op Pain Management:  Regional for Post-op pain   Induction:   PONV Risk Score and Plan: 3 and Ondansetron, Dexamethasone, Propofol and Midazolam  Airway Management Planned:   Additional Equipment:   Intra-op Plan:   Post-operative Plan:   Informed Consent: I have reviewed the patients History and Physical, chart, labs and discussed the procedure including the risks, benefits and alternatives for the proposed anesthesia with the patient or authorized representative who has indicated his/her understanding and acceptance.   Dental advisory given  Plan Discussed with: CRNA and Surgeon  Anesthesia Plan Comments: (Plan routine monitors, SAB with adductor canal block for post op analgesia)        Anesthesia Quick Evaluation

## 2016-11-01 NOTE — H&P (View-Only) (Signed)
Yolanda Henderson DOB: 1961/03/29 Single / Language: [place] / Race: White Female Date of Admission:  11/01/2016 CC:  Left knee pain History of Present Illness  The patient is a 56 year old female who comes in for a preoperative History and Physical. The patient is scheduled for a left total knee arthroplasty to be performed by Dr. Gus Rankin. Aluisio, MD at [facility] on 11/01/2016. The patient is a 56 year old female who presented with knee complaints. The patient reports left knee symptoms including: pain, swelling, locking, catching, stiffness and soreness which began year(s) ago without any known injury. The patient feels that the symptoms are worsening. The patient has the current diagnosis of knee osteoarthritis. Prior to being seen, the patient was previously evaluated in this clinic. Previous work-up for this problem has included knee x-rays. Past treatment for this problem has included intra-articular injection of corticosteroids years ago. She states that she was actually scheduled for left total knee about four years ago, but the knee started feeling better after her right TKA. Risk factors include total knee replacement on the right 07/31/12. She states the right knee is still doing well. She feels as though the right total knee, which we did in March of 2014 is doing fantastic. She is not having any problems with that at all. Unfortunately, her left knee is getting progressively worse. It is hurting her at all times. She says as bad as the right one was before she had that fixed. It is now limiting what she can and cannot do. She does get swelling. It does give out on her. Has not locked up on her. She has had injections in the past without benefit. She is ready to proceed with surgery. They have been treated conservatively in the past for the above stated problem and despite conservative measures, they continue to have progressive pain and severe functional limitations and dysfunction.  They have failed non-operative management including home exercise, medications, and injections. It is felt that they would benefit from undergoing total joint replacement. Risks and benefits of the procedure have been discussed with the patient and they elect to proceed with surgery. There are no active contraindications to surgery such as ongoing infection or rapidly progressive neurological disease.   Problem List/Past Medical Knee pain (M25.569)  Status post total knee replacement, right  Primary osteoarthritis of left knee (M17.12)  Urinary Frequency  Seasonal Allergies   Allergies No Known Drug Allergies  Family History Drug / Alcohol Addiction  First Degree Relatives. sister and brother Osteoporosis  grandmother mothers side Hypertension  mother, grandfather mothers side and grandfather fathers side Cancer  grandfather mothers side, grandmother fathers side and grandfather fathers side Diabetes Mellitus  grandfather fathers side Heart Disease  mother and grandfather mothers side Heart disease in female family member before age 3  Congestive Heart Failure  mother, father and grandfather mothers side Cerebrovascular Accident  mother  Social History Tobacco use  Never smoker. never smoker Alcohol use  current drinker; drinks hard liquor; only occasionally per week Exercise  Exercises never Current work status  working full time Drug/Alcohol Rehab (Currently)  no Marital status  married Illicit drug use  no Living situation  live with spouse Copy of Drug/Alcohol Rehab (Previously)  no Children  1 Tobacco / smoke exposure  yes Number of flights of stairs before winded  1 Pain Contract  no  Medication History  Calcium + D (Oral) Specific strength unknown - Active. Glucosamine Chondroitin Complx (Oral)  Active. Ibuprofen (Oral as needed) Specific strength unknown - Active.   Past Surgical History  Resection of Stomach  Hysterectomy   partial (cancerous) Arthroscopy of Knee  left Total Knee Replacement - Right    Review of Systems  General Not Present- Chills, Fatigue, Fever, Memory Loss, Night Sweats, Weight Gain and Weight Loss. Skin Not Present- Eczema, Hives, Itching, Lesions and Rash. HEENT Not Present- Dentures, Double Vision, Headache, Hearing Loss, Tinnitus and Visual Loss. Respiratory Not Present- Allergies, Chronic Cough, Coughing up blood, Shortness of breath at rest and Shortness of breath with exertion. Cardiovascular Not Present- Chest Pain, Difficulty Breathing Lying Down, Murmur, Palpitations, Racing/skipping heartbeats and Swelling. Gastrointestinal Not Present- Abdominal Pain, Bloody Stool, Constipation, Diarrhea, Difficulty Swallowing, Heartburn, Jaundice, Loss of appetitie, Nausea and Vomiting. Female Genitourinary Not Present- Blood in Urine, Discharge, Flank Pain, Incontinence, Painful Urination, Urgency, Urinary frequency, Urinary Retention, Urinating at Night and Weak urinary stream. Musculoskeletal Present- Joint Pain. Not Present- Back Pain, Joint Swelling, Morning Stiffness, Muscle Pain, Muscle Weakness and Spasms. Neurological Not Present- Blackout spells, Difficulty with balance, Dizziness, Paralysis, Tremor and Weakness. Psychiatric Not Present- Insomnia.  Vitals  Weight: 207 lb Height: 62in Body Surface Area: 1.94 m Body Mass Index: 37.86 kg/m     Physical Exam General Mental Status -Alert, cooperative and good historian. General Appearance-pleasant, Not in acute distress. Orientation-Oriented X3. Build & Nutrition-Well nourished and Well developed.  Head and Neck Head-normocephalic, atraumatic . Neck Global Assessment - supple, no bruit auscultated on the right, no bruit auscultated on the left.  Eye Pupil - Bilateral-Regular and Round. Motion - Bilateral-EOMI.  Chest and Lung Exam Auscultation Breath sounds - clear at anterior chest wall and clear  at posterior chest wall. Adventitious sounds - No Adventitious sounds.  Cardiovascular Auscultation Rhythm - Regular rate and rhythm. Heart Sounds - S1 WNL and S2 WNL. Murmurs & Other Heart Sounds - Auscultation of the heart reveals - No Murmurs.  Abdomen Palpation/Percussion Tenderness - Abdomen is non-tender to palpation. Rigidity (guarding) - Abdomen is soft. Auscultation Auscultation of the abdomen reveals - Bowel sounds normal.  Female Genitourinary Note: Not done, not pertinent to present illness   Musculoskeletal Note: On exam, she is in no distress. Her right knee looks fantastic. Range 0 to 122 with no tenderness or instability. Left knee, no effusion, slight varus, range 5 to 120. Marked crepitus on range of motion. Tender medial greater than lateral, no instability.  Her radiographs, AP and lateral both knees show the prosthesis on the right in excellent position with no periprosthetic abnormalities. On the left, she has bone-on-bone arthritis in the medial and patellofemoral compartments with varus deformity.  Assessment & Plan  Primary osteoarthritis of left knee (M17.12) Status post total knee replacement, right  Note:Surgical Plans: Left Total Knee Replacement  Disposition: Home with help  PCP: Dr. Janey GreaserKendal - most recent physical April 2018  IV TXA  Anesthesia Issues: None  Patient was instructed on what medications to stop prior to surgery.  Signed electronically by Lauraine RinneAlexzandrew L Chasiti Waddington, III PA-

## 2016-11-01 NOTE — Anesthesia Procedure Notes (Signed)
Spinal  Patient location during procedure: OR End time: 11/01/2016 3:02 PM Staffing Anesthesiologist: Jairo BenJACKSON, [name] Scarlett Performed: anesthesiologist  Preanesthetic Checklist Completed: patient identified, site marked, surgical consent, pre-op evaluation, timeout performed, IV checked, risks and benefits discussed and monitors and equipment checked Spinal Block Prep: site prepped and draped and DuraPrep Patient monitoring: blood pressure, continuous pulse ox, cardiac monitor and heart rate Approach: midline Location: L3-4 Injection technique: single-shot Needle Needle type: Pencan  Needle gauge: 25 G Needle length: 9 cm Additional Notes Pt identified in Operating room.  Monitors applied. Working IV access confirmed. Sterile prep, drape lumbar spine.  1% lido local L 3,4.  #25ga Pencan into clear CSF L 3,4.  15mg  0.75% Bupivacaine with dextrose injected with asp CSF beginning and end of injection.  Patient asymptomatic, VSS, no heme aspirated, tolerated well.  Yolanda Craze Jeremian Whitby, MD

## 2016-11-01 NOTE — Discharge Instructions (Addendum)
° °Dr. Frank Aluisio °Total Joint Specialist °[facility] °[address] °[place], [place] [address] °[phone] ° °TOTAL KNEE REPLACEMENT POSTOPERATIVE DIRECTIONS ° °Knee Rehabilitation, Guidelines Following Surgery  °Results after knee surgery are often greatly improved when you follow the exercise, range of motion and muscle strengthening exercises prescribed by your doctor. Safety measures are also important to protect the knee from further injury. Any time any of these exercises cause you to have increased pain or swelling in your knee joint, decrease the amount until you are comfortable again and slowly increase them. If you have problems or questions, call your caregiver or physical therapist for advice.  ° °HOME CARE INSTRUCTIONS  °Remove items at home which could result in a fall. This includes throw rugs or furniture in walking pathways.  °· ICE to the affected knee every three hours for 30 minutes at a time and then as needed for pain and swelling.  Continue to use ice on the knee for pain and swelling from surgery. You may notice swelling that will progress down to the foot and ankle.  This is normal after surgery.  Elevate the leg when you are not up walking on it.   °· Continue to use the breathing machine which will help keep your temperature down.  It is common for your temperature to cycle up and down following surgery, especially at night when you are not up moving around and exerting yourself.  The breathing machine keeps your lungs expanded and your temperature down. °· Do not place pillow under knee, focus on keeping the knee straight while resting ° °DIET °You may resume your previous home diet once your are discharged from the hospital. ° °DRESSING / WOUND CARE / SHOWERING °You may shower 3 days after surgery, but keep the wounds dry during showering.  You may use an occlusive plastic wrap (Press'n Seal for example), NO SOAKING/SUBMERGING IN THE BATHTUB.  If the  bandage gets wet, change with a clean dry gauze.  If the incision gets wet, pat the wound dry with a clean towel. °You may start showering once you are discharged home but do not submerge the incision under water. Just pat the incision dry and apply a dry gauze dressing on daily. °Change the surgical dressing daily and reapply a dry dressing each time. ° °ACTIVITY °Walk with your walker as instructed. °Use walker as long as suggested by your caregivers. °Avoid periods of inactivity such as sitting longer than an hour when not asleep. This helps prevent blood clots.  °You may resume a sexual relationship in one month or when given the OK by your doctor.  °You may return to work once you are cleared by your doctor.  °Do not drive a car for 6 weeks or until released by you surgeon.  °Do not drive while taking narcotics. ° °WEIGHT BEARING °Weight bearing as tolerated with assist device (walker, cane, etc) as directed, use it as long as suggested by your surgeon or therapist, typically at least 4-6 weeks. ° °POSTOPERATIVE CONSTIPATION PROTOCOL °Constipation - defined medically as fewer than three stools per week and severe constipation as less than one stool per week. ° °One of the most common issues patients have following surgery is constipation.  Even if you have a regular bowel pattern at home, your normal regimen is likely to be disrupted due to multiple reasons following surgery.  Combination of anesthesia, postoperative narcotics, change in appetite and fluid intake all can affect your bowels.    In order to avoid complications following surgery, here are some recommendations in order to help you during your recovery period. ° °Colace (docusate) - Pick up an over-the-counter form of Colace or another stool softener and take twice a day as long as you are requiring postoperative pain medications.  Take with a full glass of water daily.  If you experience loose stools or diarrhea, hold the colace until you stool forms  back up.  If your symptoms do not get better within 1 week or if they get worse, check with your doctor. ° °Dulcolax (bisacodyl) - Pick up over-the-counter and take as directed by the product packaging as needed to assist with the movement of your bowels.  Take with a full glass of water.  Use this product as needed if not relieved by Colace only.  ° °MiraLax (polyethylene glycol) - Pick up over-the-counter to have on hand.  MiraLax is a solution that will increase the amount of water in your bowels to assist with bowel movements.  Take as directed and can mix with a glass of water, juice, soda, coffee, or tea.  Take if you go more than two days without a movement. °Do not use MiraLax more than once per day. Call your doctor if you are still constipated or irregular after using this medication for 7 days in a row. ° °If you continue to have problems with postoperative constipation, please contact the office for further assistance and recommendations.  If you experience "the worst abdominal pain ever" or develop nausea or vomiting, please contact the office immediatly for further recommendations for treatment. ° °ITCHING ° If you experience itching with your medications, try taking only a single pain pill, or even half a pain pill at a time.  You can also use Benadryl over the counter for itching or also to help with sleep.  ° °TED HOSE STOCKINGS °Wear the elastic stockings on both legs for three weeks following surgery during the day but you may remove then at night for sleeping. ° °MEDICATIONS °See your medication summary on the “After Visit Summary” that the nursing staff will review with you prior to discharge.  You may have some home medications which will be placed on hold until you complete the course of blood thinner medication.  It is important for you to complete the blood thinner medication as prescribed by your surgeon.  Continue your approved medications as instructed at time of  discharge. ° °PRECAUTIONS °If you experience chest pain or shortness of breath - call 911 immediately for transfer to the hospital emergency department.  °If you develop a fever greater that 101 F, purulent drainage from wound, increased redness or drainage from wound, foul odor from the wound/dressing, or calf pain - CONTACT YOUR SURGEON.   °                                                °FOLLOW-UP APPOINTMENTS °Make sure you keep all of your appointments after your operation with your surgeon and caregivers. You should call the office at the above phone number and make an appointment for approximately two weeks after the date of your surgery or on the date instructed by your surgeon outlined in the "After Visit Summary". ° ° °RANGE OF MOTION AND STRENGTHENING EXERCISES  °Rehabilitation of the knee is important following a knee injury or   an operation. After just a few days of immobilization, the muscles of the thigh which control the knee become weakened and shrink (atrophy). Knee exercises are designed to build up the tone and strength of the thigh muscles and to improve knee motion. Often times heat used for twenty to thirty minutes before working out will loosen up your tissues and help with improving the range of motion but do not use heat for the first two weeks following surgery. These exercises can be done on a training (exercise) mat, on the floor, on a table or on a bed. Use what ever works the best and is most comfortable for you Knee exercises include:  °Leg Lifts - While your knee is still immobilized in a splint or cast, you can do straight leg raises. Lift the leg to 60 degrees, hold for 3 sec, and slowly lower the leg. Repeat 10-20 times 2-3 times daily. Perform this exercise against resistance later as your knee gets better.  °Quad and Hamstring Sets - Tighten up the muscle on the front of the thigh (Quad) and hold for 5-10 sec. Repeat this 10-20 times hourly. Hamstring sets are done by pushing the  foot backward against an object and holding for 5-10 sec. Repeat as with quad sets.  °· Leg Slides: Lying on your back, slowly slide your foot toward your buttocks, bending your knee up off the floor (only go as far as is comfortable). Then slowly slide your foot back down until your leg is flat on the floor again. °· Angel Wings: Lying on your back spread your legs to the side as far apart as you can without causing discomfort.  °A rehabilitation program following serious knee injuries can speed recovery and prevent re-injury in the future due to weakened muscles. Contact your doctor or a physical therapist for more information on knee rehabilitation.  ° °IF YOU ARE TRANSFERRED TO A SKILLED REHAB FACILITY °If the patient is transferred to a skilled rehab facility following release from the hospital, a list of the current medications will be sent to the facility for the patient to continue.  When discharged from the skilled rehab facility, please have the facility set up the patient's Home Health Physical Therapy prior to being released. Also, the skilled facility will be responsible for providing the patient with their medications at time of release from the facility to include their pain medication, the muscle relaxants, and their blood thinner medication. If the patient is still at the rehab facility at time of the two week follow up appointment, the skilled rehab facility will also need to assist the patient in arranging follow up appointment in our office and any transportation needs. ° °MAKE SURE YOU:  °Understand these instructions.  °Get help right away if you are not doing well or get worse.  ° ° °Pick up stool softner and laxative for home use following surgery while on pain medications. °Do not submerge incision under water. °Please use good hand washing techniques while changing dressing each day. °May shower starting three days after surgery. °Please use a clean towel to pat the incision dry following  showers. °Continue to use ice for pain and swelling after surgery. °Do not use any lotions or creams on the incision until instructed by your surgeon. ° °Take Xarelto for two and a half more weeks following discharge from the hospital, then discontinue Xarelto. °Once the patient has completed the blood thinner regimen, then take a Baby 81 mg Aspirin daily for three   more weeks. ° ° ° °Information on my medicine - XARELTO® (Rivaroxaban) ° °This medication education was reviewed with me or my healthcare representative as part of my discharge preparation.   ° °Why was Xarelto® prescribed for you? °Xarelto® was prescribed for you to reduce the risk of blood clots forming after orthopedic surgery. The medical term for these abnormal blood clots is venous thromboembolism (VTE). ° °What do you need to know about xarelto® ? °Take your Xarelto® ONCE DAILY at the same time every day. °You may take it either with or without food. ° °If you have difficulty swallowing the tablet whole, you may crush it and mix in applesauce just prior to taking your dose. ° °Take Xarelto® exactly as prescribed by your doctor and DO NOT stop taking Xarelto® without talking to the doctor who prescribed the medication.  Stopping without other VTE prevention medication to take the place of Xarelto® may increase your risk of developing a clot. ° °After discharge, you should have regular check-up appointments with your healthcare provider that is prescribing your Xarelto®.   ° °What do you do if you miss a dose? °If you miss a dose, take it as soon as you remember on the same day then continue your regularly scheduled once daily regimen the next day. Do not take two doses of Xarelto® on the same day.  ° °Important Safety Information °A possible side effect of Xarelto® is bleeding. You should call your healthcare provider right away if you experience any of the following: °? Bleeding from an injury or your nose that does not stop. °? Unusual colored  urine (red or dark brown) or unusual colored stools (red or black). °? Unusual bruising for unknown reasons. °? A serious fall or if you hit your head (even if there is no bleeding). ° °Some medicines may interact with Xarelto® and might increase your risk of bleeding while on Xarelto®. To help avoid this, consult your healthcare provider or pharmacist prior to using any new prescription or non-prescription medications, including herbals, vitamins, non-steroidal anti-inflammatory drugs (NSAIDs) and supplements. ° °This website has more information on Xarelto®: www.[email or web address].com. ° ° °

## 2016-11-01 NOTE — Anesthesia Procedure Notes (Signed)
Procedure Name: MAC Date/Time: 11/01/2016 2:57 PM Performed by: Dione Booze Pre-anesthesia Checklist: Patient identified, Emergency Drugs available, Suction available and Patient being monitored Patient Re-evaluated:Patient Re-evaluated prior to inductionOxygen Delivery Method: Simple face mask Placement Confirmation: positive ETCO2

## 2016-11-01 NOTE — Interval H&P Note (Signed)
History and Physical Interval Note:  11/01/2016 1:36 PM  Yolanda Henderson  has presented today for surgery, with the diagnosis of Osteoarthritis Left Knee  The various methods of treatment have been discussed with the patient and family. After consideration of risks, benefits and other options for treatment, the patient has consented to  Procedure(s): LEFT TOTAL KNEE ARTHROPLASTY (Left) as a surgical intervention .  The patient's history has been reviewed, patient examined, no change in status, stable for surgery.  I have reviewed the patient's chart and labs.  Questions were answered to the patient's satisfaction.     Loanne DrillingALUISIO,Ashanti Littles V

## 2016-11-02 ENCOUNTER — Encounter (HOSPITAL_COMMUNITY): Payer: Self-pay | Admitting: Orthopedic Surgery

## 2016-11-02 LAB — BASIC METABOLIC PANEL
Anion gap: 11 (ref 5–15)
BUN: 14 mg/dL (ref 6–20)
CALCIUM: 8.7 mg/dL — AB (ref 8.9–10.3)
CO2: 24 mmol/L (ref 22–32)
CREATININE: 0.77 mg/dL (ref 0.44–1.00)
Chloride: 104 mmol/L (ref 101–111)
GFR calc Af Amer: >60 mL/min (ref >60)
GFR calc non Af Amer: >60 mL/min (ref >60)
GLUCOSE: 145 mg/dL — AB (ref 65–99)
Potassium: 4 mmol/L (ref 3.5–5.1)
Sodium: 139 mmol/L (ref 135–145)

## 2016-11-02 LAB — CBC
HEMATOCRIT: 37.2 % (ref 36.0–46.0)
Hemoglobin: 12.5 g/dL (ref 12.0–15.0)
MCH: 30.3 pg (ref 26.0–34.0)
MCHC: 33.6 g/dL (ref 30.0–36.0)
MCV: 90.1 fL (ref 78.0–100.0)
Platelets: 277 10*3/uL (ref 150–400)
RBC: 4.13 MIL/uL (ref 3.87–5.11)
RDW: 12.8 % (ref 11.5–15.5)
WBC: 15.4 10*3/uL — ABNORMAL HIGH (ref 4.0–10.5)

## 2016-11-02 MED ORDER — RIVAROXABAN 10 MG PO TABS: 10.0000 mg | ORAL_TABLET | Freq: Every day | ORAL | 0 refills | Status: AC

## 2016-11-02 MED ORDER — METHOCARBAMOL 500 MG PO TABS: 500.0000 mg | ORAL_TABLET | Freq: Four times a day (QID) | ORAL | 0 refills | Status: AC | PRN

## 2016-11-02 MED ORDER — TRAMADOL HCL 50 MG PO TABS: 50.0000 mg | ORAL_TABLET | Freq: Four times a day (QID) | ORAL | 0 refills | Status: AC | PRN

## 2016-11-02 MED ORDER — OXYCODONE HCL 5 MG PO TABS: 5.0000 mg | ORAL_TABLET | ORAL | 0 refills | Status: AC | PRN

## 2016-11-02 NOTE — Discharge Summary (Signed)
Physician Discharge Summary   Patient ID: Yolanda Henderson MRN: [id number] DOB/AGE: 57-05-1960 57 y.o.  Admit date: 11/01/2016 Discharge date: 11-03-2016  Primary Diagnosis:  Osteoarthritis  Left knee(s)  Admission Diagnoses:  Past Medical History:  Diagnosis Date  . Arthritis   . Diarrhea   . Frequency-urgency syndrome   . GERD (gastroesophageal reflux disease)    occasional  . History of chickenpox   . Obesity   . Overactive bladder   . Psoriasis of scalp    Discharge Diagnoses:   Active Problems:   OA (osteoarthritis) of knee  Estimated body mass index is 38.04 kg/m as calculated from the following:   Height as of this encounter: '5\' 2"'  (1.575 m).   Weight as of this encounter: 94.3 kg (208 lb).  Procedure:  Procedure(s) (LRB): LEFT TOTAL KNEE ARTHROPLASTY (Left)   Consults: None  HPI: Yolanda Henderson is a 56 y.o. year old female with end stage OA of her left knee with progressively worsening pain and dysfunction. She has constant pain, with activity and at rest and significant functional deficits with difficulties even with ADLs. She has had extensive non-op management including analgesics, injections of cortisone and viscosupplements, and home exercise program, but remains in significant pain with significant dysfunction. Radiographs show bone on bone arthritis medial and patellofemoral. She presents now for left Total Knee Arthroplasty.   Laboratory Data: Admission on 11/01/2016  Component Date Value Ref Range Status  . WBC 11/01/2016 13.1* 4.0 - 10.5 K/uL Final  . RBC 11/01/2016 4.53  3.87 - 5.11 MIL/uL Final  . Hemoglobin 11/01/2016 13.8  12.0 - 15.0 g/dL Final  . HCT 11/01/2016 41.5  36.0 - 46.0 % Final  . MCV 11/01/2016 91.6  78.0 - 100.0 fL Final  . MCH 11/01/2016 30.5  26.0 - 34.0 pg Final  . MCHC 11/01/2016 33.3  30.0 - 36.0 g/dL Final  . RDW 11/01/2016 13.0  11.5 - 15.5 % Final  . Platelets 11/01/2016 273  150 - 400 K/uL Final  . Neutrophils  Relative % 11/01/2016 86  % Final  . Neutro Abs 11/01/2016 11.2* 1.7 - 7.7 K/uL Final  . Lymphocytes Relative 11/01/2016 11  % Final  . Lymphs Abs 11/01/2016 1.4  0.7 - 4.0 K/uL Final  . Monocytes Relative 11/01/2016 2  % Final  . Monocytes Absolute 11/01/2016 0.3  0.1 - 1.0 K/uL Final  . Eosinophils Relative 11/01/2016 1  % Final  . Eosinophils Absolute 11/01/2016 0.1  0.0 - 0.7 K/uL Final  . Basophils Relative 11/01/2016 0  % Final  . Basophils Absolute 11/01/2016 0.0  0.0 - 0.1 K/uL Final  . Sodium 11/01/2016 141  135 - 145 mmol/L Final  . Potassium 11/01/2016 3.9  3.5 - 5.1 mmol/L Final  . Chloride 11/01/2016 108  101 - 111 mmol/L Final  . CO2 11/01/2016 29  22 - 32 mmol/L Final  . Glucose, Bld 11/01/2016 118* 65 - 99 mg/dL Final  . BUN 11/01/2016 13  6 - 20 mg/dL Final  . Creatinine, Ser 11/01/2016 0.77  0.44 - 1.00 mg/dL Final  . Calcium 11/01/2016 8.8* 8.9 - 10.3 mg/dL Final  . Total Protein 11/01/2016 7.3  6.5 - 8.1 g/dL Final  . Albumin 11/01/2016 4.1  3.5 - 5.0 g/dL Final  . AST 11/01/2016 19  15 - 41 U/L Final  . ALT 11/01/2016 15  14 - 54 U/L Final  . Alkaline Phosphatase 11/01/2016 80  38 - 126 U/L Final  . Total  Bilirubin 11/01/2016 0.5  0.3 - 1.2 mg/dL Final  . GFR calc non Af Amer 11/01/2016 >60  >60 mL/min Final  . GFR calc Af Amer 11/01/2016 >60  >60 mL/min Final   Comment: (NOTE) The eGFR has been calculated using the CKD EPI equation. This calculation has not been validated in all clinical situations. eGFR's persistently <60 mL/min signify possible Chronic Kidney Disease.   . Anion gap 11/01/2016 4* 5 - 15 Final  . WBC 11/02/2016 15.4* 4.0 - 10.5 K/uL Final  . RBC 11/02/2016 4.13  3.87 - 5.11 MIL/uL Final  . Hemoglobin 11/02/2016 12.5  12.0 - 15.0 g/dL Final  . HCT 11/02/2016 37.2  36.0 - 46.0 % Final  . MCV 11/02/2016 90.1  78.0 - 100.0 fL Final  . MCH 11/02/2016 30.3  26.0 - 34.0 pg Final  . MCHC 11/02/2016 33.6  30.0 - 36.0 g/dL Final  . RDW 11/02/2016  12.8  11.5 - 15.5 % Final  . Platelets 11/02/2016 277  150 - 400 K/uL Final  . Sodium 11/02/2016 139  135 - 145 mmol/L Final  . Potassium 11/02/2016 4.0  3.5 - 5.1 mmol/L Final  . Chloride 11/02/2016 104  101 - 111 mmol/L Final  . CO2 11/02/2016 24  22 - 32 mmol/L Final  . Glucose, Bld 11/02/2016 145* 65 - 99 mg/dL Final  . BUN 11/02/2016 14  6 - 20 mg/dL Final  . Creatinine, Ser 11/02/2016 0.77  0.44 - 1.00 mg/dL Final  . Calcium 11/02/2016 8.7* 8.9 - 10.3 mg/dL Final  . GFR calc non Af Amer 11/02/2016 >60  >60 mL/min Final  . GFR calc Af Amer 11/02/2016 >60  >60 mL/min Final   Comment: (NOTE) The eGFR has been calculated using the CKD EPI equation. This calculation has not been validated in all clinical situations. eGFR's persistently <60 mL/min signify possible Chronic Kidney Disease.   Georgiann Hahn gap 11/02/2016 11  5 - 15 Final  Hospital Outpatient Visit on 10/27/2016  Component Date Value Ref Range Status  . aPTT 10/27/2016 28  24 - 36 seconds Final  . WBC 10/27/2016 5.3  4.0 - 10.5 K/uL Final  . RBC 10/27/2016 4.60  3.87 - 5.11 MIL/uL Final  . Hemoglobin 10/27/2016 13.7  12.0 - 15.0 g/dL Final  . HCT 10/27/2016 41.8  36.0 - 46.0 % Final  . MCV 10/27/2016 90.9  78.0 - 100.0 fL Final  . MCH 10/27/2016 29.8  26.0 - 34.0 pg Final  . MCHC 10/27/2016 32.8  30.0 - 36.0 g/dL Final  . RDW 10/27/2016 12.9  11.5 - 15.5 % Final  . Platelets 10/27/2016 274  150 - 400 K/uL Final  . Sodium 10/27/2016 141  135 - 145 mmol/L Final  . Potassium 10/27/2016 4.3  3.5 - 5.1 mmol/L Final  . Chloride 10/27/2016 108  101 - 111 mmol/L Final  . CO2 10/27/2016 27  22 - 32 mmol/L Final  . Glucose, Bld 10/27/2016 79  65 - 99 mg/dL Final  . BUN 10/27/2016 18  6 - 20 mg/dL Final  . Creatinine, Ser 10/27/2016 0.74  0.44 - 1.00 mg/dL Final  . Calcium 10/27/2016 8.8* 8.9 - 10.3 mg/dL Final  . Total Protein 10/27/2016 7.1  6.5 - 8.1 g/dL Final  . Albumin 10/27/2016 3.9  3.5 - 5.0 g/dL Final  . AST 10/27/2016  16  15 - 41 U/L Final  . ALT 10/27/2016 13* 14 - 54 U/L Final  . Alkaline Phosphatase 10/27/2016 81  38 -  126 U/L Final  . Total Bilirubin 10/27/2016 0.5  0.3 - 1.2 mg/dL Final  . GFR calc non Af Amer 10/27/2016 >60  >60 mL/min Final  . GFR calc Af Amer 10/27/2016 >60  >60 mL/min Final   Comment: (NOTE) The eGFR has been calculated using the CKD EPI equation. This calculation has not been validated in all clinical situations. eGFR's persistently <60 mL/min signify possible Chronic Kidney Disease.   . Anion gap 10/27/2016 6  5 - 15 Final  . Prothrombin Time 10/27/2016 11.9  11.4 - 15.2 seconds Final  . INR 10/27/2016 0.88   Final  . ABO/RH(D) 10/27/2016 O POS   Final  . Antibody Screen 10/27/2016 NEG   Final  . Sample Expiration 10/27/2016 11/04/2016   Final  . Extend sample reason 10/27/2016 NO TRANSFUSIONS OR PREGNANCY IN THE PAST 3 MONTHS   Final  . MRSA, PCR 10/27/2016 NEGATIVE  NEGATIVE Final  . Staphylococcus aureus 10/27/2016 NEGATIVE  NEGATIVE Final   Comment:        The Xpert SA Assay (FDA approved for NASAL specimens in patients over 55 years of age), is one component of a comprehensive surveillance program.  Test performance has been validated by [facility] for patients greater than or equal to 38 year old. It is not intended to diagnose infection nor to guide or monitor treatment.      X-Rays:No results found.  EKG:No orders found for this or any previous visit.   Hospital Course: Yolanda Henderson is a 56 y.o. who was admitted to [facility]. They were brought to the operating room on 11/01/2016 and underwent Procedure(s): LEFT TOTAL KNEE ARTHROPLASTY.  Patient tolerated the procedure well and was later transferred to the recovery room and then to the orthopaedic floor for postoperative care.  They were given PO and IV analgesics for pain control following their surgery.  They were given 24 hours of postoperative antibiotics of  Anti-infectives     Start     Dose/Rate Route Frequency Ordered Stop   11/01/16 2100  ceFAZolin (ANCEF) IVPB 2g/100 mL premix     2 g 200 mL/hr over 30 Minutes Intravenous Every 6 hours 11/01/16 1738 11/02/16 0238   11/01/16 1315  ceFAZolin (ANCEF) IVPB 2g/100 mL premix     2 g 200 mL/hr over 30 Minutes Intravenous On call to O.R. 11/01/16 1249 11/01/16 1531   11/01/16 1303  ceFAZolin (ANCEF) 2-4 GM/100ML-% IVPB    Comments:  Domenic Moras   : cabinet override      11/01/16 1303 11/02/16 0114     and started on DVT prophylaxis in the form of Xarelto.   PT and OT were ordered for total joint protocol.  Discharge planning consulted to help with postop disposition and equipment needs.  Patient had a decent night on the evening of surgery.  They started to get up OOB with therapy on day one. Hemovac drain was pulled without difficulty.  Continued to work with therapy into day two.  Dressing was changed on day two and the incision was healing well. Patient was seen in rounds on POD 2 and was ready to go home.  Discharge home with home health Diet - Regular diet Follow up - in 2 weeks Activity - WBAT Disposition - Home Condition Upon Discharge - Good D/C Meds - See DC Summary DVT Prophylaxis - Xarelto  Discharge Instructions    Call MD / Call 911    Complete by:  As directed  If you experience chest pain or shortness of breath, CALL 911 and be transported to the hospital emergency room.  If you develope a fever above 101 F, pus (white drainage) or increased drainage or redness at the wound, or calf pain, call your surgeon's office.   Change dressing    Complete by:  As directed    Change dressing daily with sterile 4 x 4 inch gauze dressing and apply TED hose. Do not submerge the incision under water.   Constipation Prevention    Complete by:  As directed    Drink plenty of fluids.  Prune juice may be helpful.  You may use a stool softener, such as Colace (over the counter) 100 mg twice a day.  Use MiraLax  (over the counter) for constipation as needed.   Diet - low sodium heart healthy    Complete by:  As directed    Discharge instructions    Complete by:  As directed    Take Xarelto for two and a half more weeks, then discontinue Xarelto. Once the patient has completed the blood thinner regimen, then take a Baby 81 mg Aspirin daily for three more weeks.   Pick up stool softner and laxative for home use following surgery while on pain medications. Do not submerge incision under water. Please use good hand washing techniques while changing dressing each day. May shower starting three days after surgery. Please use a clean towel to pat the incision dry following showers. Continue to use ice for pain and swelling after surgery. Do not use any lotions or creams on the incision until instructed by your surgeon.  Wear both TED hose on both legs during the day every day for three weeks, but may remove the TED hose at night at home.  Postoperative Constipation Protocol  Constipation - defined medically as fewer than three stools per week and severe constipation as less than one stool per week.  One of the most common issues patients have following surgery is constipation.  Even if you have a regular bowel pattern at home, your normal regimen is likely to be disrupted due to multiple reasons following surgery.  Combination of anesthesia, postoperative narcotics, change in appetite and fluid intake all can affect your bowels.  In order to avoid complications following surgery, here are some recommendations in order to help you during your recovery period.  Colace (docusate) - Pick up an over-the-counter form of Colace or another stool softener and take twice a day as long as you are requiring postoperative pain medications.  Take with a full glass of water daily.  If you experience loose stools or diarrhea, hold the colace until you stool forms back up.  If your symptoms do not get better within 1 week  or if they get worse, check with your doctor.  Dulcolax (bisacodyl) - Pick up over-the-counter and take as directed by the product packaging as needed to assist with the movement of your bowels.  Take with a full glass of water.  Use this product as needed if not relieved by Colace only.   MiraLax (polyethylene glycol) - Pick up over-the-counter to have on hand.  MiraLax is a solution that will increase the amount of water in your bowels to assist with bowel movements.  Take as directed and can mix with a glass of water, juice, soda, coffee, or tea.  Take if you go more than two days without a movement. Do not use MiraLax more than once per day.  Call your doctor if you are still constipated or irregular after using this medication for 7 days in a row.  If you continue to have problems with postoperative constipation, please contact the office for further assistance and recommendations.  If you experience "the worst abdominal pain ever" or develop nausea or vomiting, please contact the office immediatly for further recommendations for treatment.   Do not put a pillow under the knee. Place it under the heel.    Complete by:  As directed    Do not sit on low chairs, stoools or toilet seats, as it may be difficult to get up from low surfaces    Complete by:  As directed    Driving restrictions    Complete by:  As directed    No driving until released by the physician.   Increase activity slowly as tolerated    Complete by:  As directed    Lifting restrictions    Complete by:  As directed    No lifting until released by the physician.   Patient may shower    Complete by:  As directed    You may shower without a dressing once there is no drainage.  Do not wash over the wound.  If drainage remains, do not shower until drainage stops.   TED hose    Complete by:  As directed    Use stockings (TED hose) for 3 weeks on both leg(s).  You may remove them at night for sleeping.   Weight bearing as  tolerated    Complete by:  As directed    Laterality:  left   Extremity:  Lower     Allergies as of 11/02/2016   No Known Allergies     Medication List    STOP taking these medications   GLUCOSAMINE CHOND DOUBLE STR PO   ibuprofen 200 MG tablet Commonly known as:  ADVIL,MOTRIN   Vitamin D 2000 units tablet     TAKE these medications   methocarbamol 500 MG tablet Commonly known as:  ROBAXIN Take 1 tablet (500 mg total) by mouth every 6 (six) hours as needed for muscle spasms.   oxyCODONE 5 MG immediate release tablet Commonly known as:  Oxy IR/ROXICODONE Take 1-2 tablets (5-10 mg total) by mouth every 4 (four) hours as needed for moderate pain or severe pain.   rivaroxaban 10 MG Tabs tablet Commonly known as:  XARELTO Take 1 tablet (10 mg total) by mouth daily with breakfast. Take Xarelto for two and a half more weeks following discharge from the hospital, then discontinue Xarelto. Once the patient has completed the blood thinner regimen, then take a Baby 81 mg Aspirin daily for three more weeks. Start taking on:  11/03/2016   traMADol 50 MG tablet Commonly known as:  ULTRAM Take 1-2 tablets (50-100 mg total) by mouth every 6 (six) hours as needed for moderate pain.      Follow-up Information    [facility] Follow up.   Specialty:  [place] Why:  physical therapy Contact information: [place] [address] [phone]        Gaynelle Arabian, MD. Schedule an appointment as soon as possible for a visit on 11/16/2016.   Specialty:  Orthopedic Surgery Contact information: [address] [place] [address] [phone]           Signed: Arlee Muslim, PA-C Orthopaedic Surgery 11/02/2016, 9:31 PM

## 2016-11-02 NOTE — Evaluation (Signed)
Occupational Therapy Evaluation Patient Details Name: Yolanda Henderson MRN: [id number] DOB: 10/23/1960 Today's Date: 11/02/2016    History of Present Illness Pt is a 56 year old female s/p L TKA with hx R TKA 2014   Clinical Impression   Pt was admitted for the above surgery. All education was completed. No further OT is needed at this time    Follow Up Recommendations  No OT follow up    Equipment Recommendations  None recommended by OT    Recommendations for Other Services       Precautions / Restrictions Precautions Precautions: Knee;Fall Precaution Comments: able to perform SLR Restrictions Weight Bearing Restrictions: No Other Position/Activity Restrictions: WBAT      Mobility Bed Mobility by PT Overal bed mobility: Needs Assistance Bed Mobility: Supine to Sit     Supine to sit: Supervision;HOB elevated        Transfers Overall transfer level: Needs assistance Equipment used: Rolling walker (2 wheeled) Transfers: Sit to/from Stand Sit to Stand: Supervision         General transfer comment: verbal cues for UE and LE positioning    Balance                                           ADL either performed or assessed with clinical judgement   ADL Overall ADL's : Needs assistance/impaired     Grooming: Wash/dry hands;Supervision/safety;Standing   Upper Body Bathing: Set up;Sitting   Lower Body Bathing: Minimal assistance;Sit to/from stand   Upper Body Dressing : Set up;Sitting   Lower Body Dressing: Minimal assistance;Sit to/from stand   Toilet Transfer: Supervision/safety;Ambulation;BSC;RW   Toileting- Clothing Manipulation and Hygiene: Supervision/safety;Sit to/from stand         General ADL Comments: Pt had her other knee done in '14. She feels comfortable with her high commode and grab bar. Reviewed shower sequence, but pt did not feel she needed to practice. She will have 24/7 for a week then intermittent assistance.   Reviewed safety and knee precautions     Vision         Perception     Praxis      Pertinent Vitals/Pain Pain Assessment: 0-10 Pain Score: 4  Pain Location: L knee Pain Descriptors / Indicators: Aching;Sore Pain Intervention(s): Limited activity within patient's tolerance;Monitored during session;Repositioned;Ice applied (pt did not need further meds)     Hand Dominance     Extremity/Trunk Assessment Upper Extremity Assessment Upper Extremity Assessment: Overall WFL for tasks assessed          Communication Communication Communication: No difficulties   Cognition Arousal/Alertness: Awake/alert Behavior During Therapy: WFL for tasks assessed/performed Overall Cognitive Status: Within Functional Limits for tasks assessed                                     General Comments       Exercises    Shoulder Instructions      Home Living Family/patient expects to be discharged to:: Private residence Living Arrangements: Alone Available Help at Discharge: Friend(s);Family Type of Home: House Home Access: Level entry     Home Layout: One level     Bathroom Shower/Tub: Producer, television/film/videoWalk-in shower   Bathroom Toilet: Handicapped height     Home Equipment: Grab bars - toilet;Shower seat  Prior Functioning/Environment Level of Independence: Independent                 OT Problem List:        OT Treatment/Interventions:      OT Goals(Current goals can be found in the care plan section) Acute Rehab OT Goals Patient Stated Goal: plans to have HHPT for a couple weeks and [place] OT Goal Formulation: All assessment and education complete, DC therapy  OT Frequency:     Barriers to D/C:            Co-evaluation              AM-PAC PT "6 Clicks" Daily Activity     Outcome Measure Help from another person eating meals?: None Help from another person taking care of personal grooming?:  A Little Help from another person toileting, which includes using toliet, bedpan, or urinal?: A Little Help from another person bathing (including washing, rinsing, drying)?: A Little Help from another person to put on and taking off regular upper body clothing?: A Little Help from another person to put on and taking off regular lower body clothing?: A Little 6 Click Score: 19   End of Session    Activity Tolerance: Patient tolerated treatment well Patient left: in chair;with call bell/phone within reach  OT Visit Diagnosis: Pain Pain - Right/Left: Left Pain - part of body: Knee                Time: 1610-9604 OT Time Calculation (min): 13 min Charges:  OT General Charges $OT Visit: 1 Procedure OT Evaluation $OT Eval Low Complexity: 1 Procedure G-Codes:     [place], OTR/L [phone] 11/02/2016  Yolanda Henderson 11/02/2016, 1:14 PM

## 2016-11-02 NOTE — Progress Notes (Signed)
   Subjective: 1 Day Post-Op Procedure(s) (LRB): LEFT TOTAL KNEE ARTHROPLASTY (Left) Patient reports pain as mild.   Patient seen in rounds for Dr. Lequita HaltAluisio. Patient is well, and has had no acute complaints or problems Start therapy today. Walked well twice with therapy Plan is to go Home after hospital stay.  Objective: Vital signs in last 24 hours: Temp:  [98.1 F (36.7 C)-99.6 F (37.6 C)] 99.6 F (37.6 C) (06/12 1339) Pulse Rate:  [59-71] 71 (06/12 1339) Resp:  [14-16] 16 (06/12 1339) BP: (114-143)/(60-84) 114/60 (06/12 1339) SpO2:  [98 %-99 %] 99 % (06/12 1339)  Intake/Output from previous day:  Intake/Output Summary (Last 24 hours) at 11/02/16 2123 Last data filed at 11/02/16 2016  Gross per 24 hour  Intake          2308.75 ml  Output             4045 ml  Net         -1736.25 ml    Intake/Output this shift: Total I/O In: 240 [P.O.:240] Out: 275 [Urine:275]  Labs:  Recent Labs  11/01/16 1802 11/02/16 0501  HGB 13.8 12.5    Recent Labs  11/01/16 1802 11/02/16 0501  WBC 13.1* 15.4*  RBC 4.53 4.13  HCT 41.5 37.2  PLT 273 277    Recent Labs  11/01/16 1802 11/02/16 0501  NA 141 139  K 3.9 4.0  CL 108 104  CO2 29 24  BUN 13 14  CREATININE 0.77 0.77  GLUCOSE 118* 145*  CALCIUM 8.8* 8.7*   No results for input(s): LABPT, INR in the last 72 hours.  EXAM General - Patient is Alert, Appropriate and Oriented Extremity - Neurovascular intact Sensation intact distally Intact pulses distally Dorsiflexion/Plantar flexion intact Dressing - dressing C/D/I Motor Function - intact, moving foot and toes well on exam.  Hemovac pulled without difficulty.  Past Medical History:  Diagnosis Date  . Arthritis   . Diarrhea   . Frequency-urgency syndrome   . GERD (gastroesophageal reflux disease)    occasional  . History of chickenpox   . Obesity   . Overactive bladder   . Psoriasis of scalp     Assessment/Plan: 1 Day Post-Op Procedure(s)  (LRB): LEFT TOTAL KNEE ARTHROPLASTY (Left) Active Problems:   OA (osteoarthritis) of knee  Estimated body mass index is 38.04 kg/m as calculated from the following:   Height as of this encounter: 5\' 2"  (1.575 m).   Weight as of this encounter: 94.3 kg (208 lb). Up with therapy Plan for discharge tomorrow Discharge home with home health  DVT Prophylaxis - Xarelto Weight-Bearing as tolerated to left leg D/C O2 and Pulse OX and try on Room Air  Avel Peacerew Perkins, PA-C Orthopaedic Surgery 11/02/2016, 9:23 PM

## 2016-11-02 NOTE — Progress Notes (Signed)
   11/02/16 1500  PT Visit Information  Last PT Received On 11/02/16  Assistance Needed +1  History of Present Illness Pt is a 56 year old female s/p L TKA with hx R TKA 2014  Subjective Data  Subjective Pt ambulated again in hallway and progressing well.  Precautions  Precautions Knee;Fall  Precaution Comments able to perform SLR  Restrictions  Other Position/Activity Restrictions WBAT  Pain Assessment  Pain Assessment 0-10  Pain Score 2  Pain Location L knee  Pain Descriptors / Indicators Aching;Sore  Pain Intervention(s) Limited activity within patient's tolerance;Monitored during session;Repositioned  Cognition  Arousal/Alertness Awake/alert  Behavior During Therapy WFL for tasks assessed/performed  Overall Cognitive Status Within Functional Limits for tasks assessed  Bed Mobility  Overal bed mobility Needs Assistance  Bed Mobility Sit to Supine  Sit to supine Supervision  Transfers  Overall transfer level Needs assistance  Equipment used Rolling walker (2 wheeled)  Transfers Sit to/from Stand  Sit to Stand Supervision  General transfer comment verbal cues for UE and LE positioning  Ambulation/Gait  Ambulation/Gait assistance Supervision  Ambulation Distance (Feet) 220 Feet  Assistive device Rolling walker (2 wheeled)  Gait Pattern/deviations Step-through pattern;Decreased stance time - left;Antalgic  General Gait Details verbal cues for sequence, RW positioning, posture, step length  PT - End of Session  Activity Tolerance Patient tolerated treatment well  Patient left in bed;with call bell/phone within reach;with nursing/sitter in room  PT - Assessment/Plan  PT Plan Current plan remains appropriate  PT Visit Diagnosis Other abnormalities of gait and mobility (R26.89);Pain  Pain - Right/Left Left  Pain - part of body Knee  PT Frequency (ACUTE ONLY) 7X/week  Follow Up Recommendations Home health PT;DC plan and follow up therapy as arranged by surgeon  PT equipment  None recommended by PT  AM-PAC PT "6 Clicks" Daily Activity Outcome Measure  Difficulty turning over in bed (including adjusting bedclothes, sheets and blankets)? 4  Difficulty moving from lying on back to sitting on the side of the bed?  4  Difficulty sitting down on and standing up from a chair with arms (e.g., wheelchair, bedside commode, etc,.)? 3  Help needed moving to and from a bed to chair (including a wheelchair)? 3  Help needed walking in hospital room? 3  Help needed climbing 3-5 steps with a railing?  3  6 Click Score 20  Mobility G Code  CJ  PT Goal Progression  Progress towards PT goals Progressing toward goals  PT Time Calculation  PT Start Time (ACUTE ONLY) 1351  PT Stop Time (ACUTE ONLY) 1359  PT Time Calculation (min) (ACUTE ONLY) 8 min  PT General Charges  $$ ACUTE PT VISIT 1 Procedure  PT Treatments  $Gait Training 8-22 mins   Zenovia JarredKati Gal Smolinski, PT, DPT 11/02/2016 Pager: [phone]

## 2016-11-02 NOTE — Evaluation (Signed)
Physical Therapy Evaluation Patient Details Name: Yolanda Henderson MRN: [id number] DOB: 12/27/1960 Today's Date: 11/02/2016   History of Present Illness  Pt is a 56 year old female s/p L TKA with hx R TKA 2014  Clinical Impression  Pt is s/p TKA resulting in the deficits listed below (see PT Problem List).  Pt will benefit from skilled PT to increase their independence and safety with mobility to allow discharge to the venue listed below.  Pt mobilizing well and tolerated LE exercises POD #1.  Pt plans to d/c home with HHPT and then switch [place]     Follow Up Recommendations Home health PT;DC plan and follow up therapy as arranged by surgeon    Equipment Recommendations  None recommended by PT    Recommendations for Other Services       Precautions / Restrictions Precautions Precautions: Knee Precaution Comments: able to perform SLR Restrictions Other Position/Activity Restrictions: WBAT      Mobility  Bed Mobility Overal bed mobility: Needs Assistance Bed Mobility: Supine to Sit     Supine to sit: Supervision;HOB elevated        Transfers Overall transfer level: Needs assistance Equipment used: Rolling walker (2 wheeled) Transfers: Sit to/from Stand Sit to Stand: Min guard         General transfer comment: verbal cues for UE and LE positioning  Ambulation/Gait Ambulation/Gait assistance: Min guard Ambulation Distance (Feet): 200 Feet Assistive device: Rolling walker (2 wheeled) Gait Pattern/deviations: Step-through pattern;Decreased stance time - left;Antalgic;Step-to pattern     General Gait Details: verbal cues for sequence, RW positioning, posture, step length  Stairs            Wheelchair Mobility    Modified Rankin (Stroke Patients Only)       Balance                                             Pertinent Vitals/Pain Pain Assessment: 0-10 Pain Score: 2  Pain Location: L  knee Pain Descriptors / Indicators: Aching;Sore Pain Intervention(s): Premedicated before session;Monitored during session;Limited activity within patient's tolerance;Repositioned;Ice applied    Home Living Family/patient expects to be discharged to:: Private residence Living Arrangements: Alone Available Help at Discharge: Family Type of Home: House Home Access: Level entry     Home Layout: One level Home Equipment: Environmental consultantWalker - 2 wheels;Bedside commode;Cane - single point      Prior Function Level of Independence: Independent               Hand Dominance        Extremity/Trunk Assessment        Lower Extremity Assessment Lower Extremity Assessment: LLE deficits/detail LLE Deficits / Details: approx 70* AAROM knee flexion, able to perform SLR       Communication   Communication: No difficulties  Cognition Arousal/Alertness: Awake/alert Behavior During Therapy: WFL for tasks assessed/performed Overall Cognitive Status: Within Functional Limits for tasks assessed                                        General Comments      Exercises Total Joint Exercises Ankle Circles/Pumps: AROM;Both;10 reps Quad Sets: AROM;Both;10 reps Short Arc Quad: AROM;Left;10 reps Heel Slides: AAROM;Left;10 reps;Seated Hip  ABduction/ADduction: AROM;Left;10 reps Straight Leg Raises: AROM;Left;10 reps   Assessment/Plan    PT Assessment Patient needs continued PT services  PT Problem List Decreased range of motion;Decreased strength;Decreased mobility;Decreased knowledge of use of DME;Pain       PT Treatment Interventions Functional mobility training;Stair training;Gait training;Therapeutic exercise;Patient/family education;DME instruction;Therapeutic activities    PT Goals (Current goals can be found in the Care Plan section)  Acute Rehab PT Goals Patient Stated Goal: plans to have HHPT for a couple weeks and then [place] PT Goal Formulation: With patient Time For Goal Achievement: 11/09/16 Potential to Achieve Goals: Good    Frequency 7X/week   Barriers to discharge        Co-evaluation               AM-PAC PT "6 Clicks" Daily Activity  Outcome Measure Difficulty turning over in bed (including adjusting bedclothes, sheets and blankets)?: None Difficulty moving from lying on back to sitting on the side of the bed? : None Difficulty sitting down on and standing up from a chair with arms (e.g., wheelchair, bedside commode, etc,.)?: A Little Help needed moving to and from a bed to chair (including a wheelchair)?: A Little Help needed walking in hospital room?: A Little Help needed climbing 3-5 steps with a railing? : A Little 6 Click Score: 20    End of Session   Activity Tolerance: Patient tolerated treatment well Patient left: in chair;with call bell/phone within reach Nurse Communication: Mobility status PT Visit Diagnosis: Other abnormalities of gait and mobility (R26.89);Pain Pain - Right/Left: Left Pain - part of body: Knee    Time: 0981-1914 PT Time Calculation (min) (ACUTE ONLY): 24 min   Charges:   PT Evaluation $PT Eval Low Complexity: 1 Procedure PT Treatments $Therapeutic Exercise: 8-22 mins   PT G Codes:        Zenovia Jarred, PT, DPT 11/02/2016 Pager: [phone]   Maida Sale E 11/02/2016, 12:08 PM

## 2016-11-02 NOTE — Progress Notes (Signed)
Discharge planning, spoke with patient and spouse at bedside. Have chosen [facility] for HH PT. Contacted [facility] for referral. Has RW and 3n1. [phone] 

## 2016-11-03 LAB — BASIC METABOLIC PANEL
ANION GAP: 7 (ref 5–15)
BUN: 17 mg/dL (ref 6–20)
CO2: 28 mmol/L (ref 22–32)
Calcium: 8.5 mg/dL — ABNORMAL LOW (ref 8.9–10.3)
Chloride: 105 mmol/L (ref 101–111)
Creatinine, Ser: 0.66 mg/dL (ref 0.44–1.00)
GFR calc Af Amer: >60 mL/min (ref >60)
GLUCOSE: 117 mg/dL — AB (ref 65–99)
POTASSIUM: 4 mmol/L (ref 3.5–5.1)
Sodium: 140 mmol/L (ref 135–145)

## 2016-11-03 LAB — CBC
HEMATOCRIT: 34.6 % — AB (ref 36.0–46.0)
HEMOGLOBIN: 11.4 g/dL — AB (ref 12.0–15.0)
MCH: 29.5 pg (ref 26.0–34.0)
MCHC: 32.9 g/dL (ref 30.0–36.0)
MCV: 89.4 fL (ref 78.0–100.0)
Platelets: 286 10*3/uL (ref 150–400)
RBC: 3.87 MIL/uL (ref 3.87–5.11)
RDW: 13 % (ref 11.5–15.5)
WBC: 14 10*3/uL — AB (ref 4.0–10.5)

## 2016-11-03 NOTE — Progress Notes (Signed)
Physical Therapy Treatment Patient Details Name: Yolanda AmisFelicia Kay Jerome MRN: [id number] DOB: 04/06/1961 Today's Date: 11/03/2016    History of Present Illness Pt is a 56 year old female s/p L TKA with hx R TKA 2014    PT Comments    Pt ambulated in hallway and performed LE exercises.  Pt feels ready for d/c home today.  Follow Up Recommendations  Home health PT;DC plan and follow up therapy as arranged by surgeon     Equipment Recommendations  None recommended by PT    Recommendations for Other Services       Precautions / Restrictions Precautions Precautions: Knee;Fall Precaution Comments: able to perform SLR Restrictions Other Position/Activity Restrictions: WBAT    Mobility  Bed Mobility Overal bed mobility: Needs Assistance Bed Mobility: Sit to Supine;Supine to Sit     Supine to sit: Min assist Sit to supine: Supervision   General bed mobility comments: assist for L LE due to more soreness and pain today  Transfers Overall transfer level: Needs assistance Equipment used: Rolling walker (2 wheeled) Transfers: Sit to/from Stand Sit to Stand: Supervision         General transfer comment: verbal cues for UE and LE positioning  Ambulation/Gait Ambulation/Gait assistance: Supervision Ambulation Distance (Feet): 160 Feet Assistive device: Rolling walker (2 wheeled) Gait Pattern/deviations: Step-through pattern;Decreased stance time - left;Antalgic     General Gait Details: verbal cues for sequence, RW positioning, posture, step length   Stairs            Wheelchair Mobility    Modified Rankin (Stroke Patients Only)       Balance                                            Cognition Arousal/Alertness: Awake/alert Behavior During Therapy: WFL for tasks assessed/performed Overall Cognitive Status: Within Functional Limits for tasks assessed                                        Exercises Total Joint  Exercises Ankle Circles/Pumps: AROM;Both;10 reps Quad Sets: AROM;Both;10 reps Short Arc Quad: AROM;Left;10 reps Heel Slides: AAROM;Left;10 reps Hip ABduction/ADduction: AROM;Left;10 reps Straight Leg Raises: Left;10 reps;AAROM    General Comments        Pertinent Vitals/Pain Pain Assessment: 0-10 Pain Score: 4  Pain Location: L knee Pain Descriptors / Indicators: Aching;Sore Pain Intervention(s): Premedicated before session;Monitored during session;Limited activity within patient's tolerance;Repositioned    Home Living                      Prior Function            PT Goals (current goals can now be found in the care plan section) Progress towards PT goals: Progressing toward goals    Frequency    7X/week      PT Plan Current plan remains appropriate    Co-evaluation              AM-PAC PT "6 Clicks" Daily Activity  Outcome Measure  Difficulty turning over in bed (including adjusting bedclothes, sheets and blankets)?: None Difficulty moving from lying on back to sitting on the side of the bed? : A Little Difficulty sitting down on and standing up from a chair with arms (e.g., wheelchair,  bedside commode, etc,.)?: A Little Help needed moving to and from a bed to chair (including a wheelchair)?: A Little Help needed walking in hospital room?: A Little Help needed climbing 3-5 steps with a railing? : A Little 6 Click Score: 19    End of Session   Activity Tolerance: Patient tolerated treatment well Patient left: in bed;with call bell/phone within reach   PT Visit Diagnosis: Other abnormalities of gait and mobility (R26.89);Pain Pain - Right/Left: Left Pain - part of body: Knee     Time: 1610-9604 PT Time Calculation (min) (ACUTE ONLY): 13 min  Charges:  $Therapeutic Exercise: 8-22 mins                    G Codes:       Zenovia Jarred, PT, DPT 11/03/2016 Pager: [phone]    Maida Sale E 11/03/2016, 12:38 PM

## 2016-11-03 NOTE — Progress Notes (Signed)
   Subjective: 2 Days Post-Op Procedure(s) (LRB): LEFT TOTAL KNEE ARTHROPLASTY (Left) Patient reports pain as mild.   Patient seen in rounds with Dr. Lequita HaltAluisio. Patient is well, and has had no acute complaints or problems Patient is ready to go home  Objective: Vital signs in last 24 hours: Temp:  [98.1 F (36.7 C)-99.6 F (37.6 C)] 98.1 F (36.7 C) (06/13 0513) Pulse Rate:  [56-71] 56 (06/13 0513) Resp:  [15-16] 16 (06/13 0513) BP: (109-125)/(60-85) 109/68 (06/13 0513) SpO2:  [98 %-99 %] 98 % (06/13 0513)  Intake/Output from previous day:  Intake/Output Summary (Last 24 hours) at 11/03/16 0712 Last data filed at 11/03/16 0513  Gross per 24 hour  Intake          1698.75 ml  Output             3100 ml  Net         -1401.25 ml    Intake/Output this shift: No intake/output data recorded.  Labs:  Recent Labs  11/01/16 1802 11/02/16 0501 11/03/16 0429  HGB 13.8 12.5 11.4*    Recent Labs  11/02/16 0501 11/03/16 0429  WBC 15.4* 14.0*  RBC 4.13 3.87  HCT 37.2 34.6*  PLT 277 286    Recent Labs  11/02/16 0501 11/03/16 0429  NA 139 140  K 4.0 4.0  CL 104 105  CO2 24 28  BUN 14 17  CREATININE 0.77 0.66  GLUCOSE 145* 117*  CALCIUM 8.7* 8.5*   No results for input(s): LABPT, INR in the last 72 hours.  EXAM: General - Patient is Alert, Appropriate and Oriented Extremity - Neurovascular intact Sensation intact distally Intact pulses distally Dorsiflexion/Plantar flexion intact Incision - clean, dry, no drainage Motor Function - intact, moving foot and toes well on exam.   Assessment/Plan: 2 Days Post-Op Procedure(s) (LRB): LEFT TOTAL KNEE ARTHROPLASTY (Left) Procedure(s) (LRB): LEFT TOTAL KNEE ARTHROPLASTY (Left) Past Medical History:  Diagnosis Date  . Arthritis   . Diarrhea   . Frequency-urgency syndrome   . GERD (gastroesophageal reflux disease)    occasional  . History of chickenpox   . Obesity   . Overactive bladder   . Psoriasis of scalp     Active Problems:   OA (osteoarthritis) of knee  Estimated body mass index is 38.04 kg/m as calculated from the following:   Height as of this encounter: 5\' 2"  (1.575 m).   Weight as of this encounter: 94.3 kg (208 lb). Up with therapy Discharge home with home health Diet - Regular diet Follow up - in 2 weeks Activity - WBAT Disposition - Home Condition Upon Discharge - Good D/C Meds - See DC Summary DVT Prophylaxis - Xarelto  Avel Peacerew Cope Marte, PA-C Orthopaedic Surgery 11/03/2016, 7:12 AM

## 2019-02-28 ENCOUNTER — Other Ambulatory Visit: Payer: Self-pay | Admitting: Family Medicine

## 2019-02-28 DIAGNOSIS — Z1231 Encounter for screening mammogram for malignant neoplasm of breast: Secondary | ICD-10-CM

## 2019-03-01 ENCOUNTER — Ambulatory Visit: Payer: BC Managed Care – PPO | Admitting: Podiatry

## 2019-03-01 ENCOUNTER — Encounter: Payer: Self-pay | Admitting: Podiatry

## 2019-03-01 ENCOUNTER — Other Ambulatory Visit: Payer: Self-pay

## 2019-03-01 ENCOUNTER — Telehealth: Payer: Self-pay | Admitting: *Deleted

## 2019-03-01 VITALS — BP 126/89

## 2019-03-01 DIAGNOSIS — I999 Unspecified disorder of circulatory system: Secondary | ICD-10-CM

## 2019-03-01 DIAGNOSIS — L600 Ingrowing nail: Secondary | ICD-10-CM

## 2019-03-01 NOTE — Telephone Encounter (Signed)
Dr. Paulla Dolly ordered ABI with TBI, arterial dopplers for r/o vascular disease. Faxed orders to [facility].

## 2019-03-01 NOTE — Progress Notes (Signed)
Subjective:   Patient ID: Yolanda Henderson, female   DOB: 58 y.o.   MRN: [id number]   HPI Patient presents with significant nail disease left big toenail entire nail right in the corner and states is been going on a long time and she is fine as long she has open toed shoes but it sore if she has any kind of close shoe on them.  Patient is interested in removal and patient does not smoke likes to be active and upon questioning does not seem to indicate signs of claudication   Review of Systems  All other systems reviewed and are negative.       Objective:  Physical Exam Vitals signs and nursing note reviewed.  Constitutional:      Appearance: She is well-developed.  Pulmonary:     Effort: Pulmonary effort is normal.  Musculoskeletal: Normal range of motion.  Skin:    General: Skin is warm.  Neurological:     Mental Status: She is alert.     Neurovascular status was found to be reduced as far as DP PT pulses left over right muscle strength was found to be adequate range of motion within normal limits.  Patient is noted to have very thickened hallux nail left which is dystrophic and painful and incurvated right hallux nail which is also painful     Assessment:  Ingrown toenail deformity with possibility for vascular disease     Plan:  H&P conditions reviewed and ABIs taken in the office indicated peripheral arterial disease which is possible but patient still does not have claudication symptomatology.  I am ordering digital flow studies before I will consider nail removal and we will get those results and decide whether or not nail removal could be at done on this patient.  Reappoint to recheck

## 2019-03-02 ENCOUNTER — Other Ambulatory Visit: Payer: Self-pay | Admitting: Podiatry

## 2019-03-02 DIAGNOSIS — I999 Unspecified disorder of circulatory system: Secondary | ICD-10-CM

## 2019-03-02 DIAGNOSIS — R0989 Other specified symptoms and signs involving the circulatory and respiratory systems: Secondary | ICD-10-CM

## 2019-03-09 ENCOUNTER — Other Ambulatory Visit: Payer: Self-pay

## 2019-03-09 ENCOUNTER — Ambulatory Visit (HOSPITAL_COMMUNITY)
Admission: RE | Admit: 2019-03-09 | Discharge: 2019-03-09 | Disposition: A | Discharge status: Home / Self Care | Payer: BC Managed Care – PPO | Source: Ambulatory Visit | Attending: Cardiovascular Disease | Admitting: Cardiovascular Disease

## 2019-03-09 DIAGNOSIS — I999 Unspecified disorder of circulatory system: Secondary | ICD-10-CM

## 2019-03-09 DIAGNOSIS — R0989 Other specified symptoms and signs involving the circulatory and respiratory systems: Secondary | ICD-10-CM | POA: Insufficient documentation

## 2019-03-28 ENCOUNTER — Ambulatory Visit: Payer: BC Managed Care – PPO | Admitting: Podiatry

## 2019-03-28 ENCOUNTER — Encounter: Payer: Self-pay | Admitting: Podiatry

## 2019-03-28 ENCOUNTER — Other Ambulatory Visit: Payer: Self-pay

## 2019-03-28 DIAGNOSIS — L600 Ingrowing nail: Secondary | ICD-10-CM | POA: Diagnosis not present

## 2019-03-28 MED ORDER — NEOMYCIN-POLYMYXIN-HC 3.5-10000-1 OT SOLN: OTIC | 1 refills | Status: AC

## 2019-03-28 NOTE — Patient Instructions (Signed)

## 2019-03-30 ENCOUNTER — Ambulatory Visit
Admission: RE | Admit: 2019-03-30 | Discharge: 2019-03-30 | Disposition: A | Discharge status: Home / Self Care | Payer: BC Managed Care – PPO | Source: Ambulatory Visit | Attending: Family Medicine | Admitting: Family Medicine

## 2019-03-30 ENCOUNTER — Other Ambulatory Visit: Payer: Self-pay

## 2019-03-30 DIAGNOSIS — Z1231 Encounter for screening mammogram for malignant neoplasm of breast: Secondary | ICD-10-CM

## 2019-04-02 NOTE — Progress Notes (Signed)
Subjective:   Patient ID: Netta Corrigan, female   DOB: 58 y.o.   MRN: [id number]   HPI Patient states that vascular studies came out well and she is here to have her big toenails removed.  States that they are chronically sore thick and she cannot take care of them herself   ROS      Objective:  Physical Exam  Neurovascular status intact and unchanged with good digital perfusion and warm toes.  Patient is found to have thickened dystrophic toenails of both big toes that are painful when pressed and are making shoe gear difficult.  Patient is tried different modalities without relief and continues to have symptoms associated with this     Assessment:  Chronic thickened painful big toenails bilateral that she cannot trim herself and she is dealt with for a long time with vascular studies indicating good circulatory status     Plan:  Reviewed condition and allowed patient to go over consent form understanding procedure risk.  Patient wants surgery and today I infiltrated each hallux 60 mg Xylocaine Marcaine mixture sterile prep applied to each big toe and using sterile instrumentation I remove the big toenails exposed matrix and applied phenol 5 applications 30 seconds followed by alcohol lavage and sterile dressing.  Gave instructions on soaks and reappoint for [place] to recheck again in the next several weeks and encouraged her to leave dressings on 24 hours but take them off earlier if there should be throbbing or other issues and wrote prescriptions for drops for the postoperative.

## 2020-03-17 ENCOUNTER — Other Ambulatory Visit: Payer: Self-pay | Admitting: Family Medicine

## 2020-03-17 DIAGNOSIS — Z1231 Encounter for screening mammogram for malignant neoplasm of breast: Secondary | ICD-10-CM

## 2020-04-22 ENCOUNTER — Ambulatory Visit
Admission: RE | Admit: 2020-04-22 | Discharge: 2020-04-22 | Disposition: A | Discharge status: Home / Self Care | Payer: BC Managed Care – PPO | Source: Ambulatory Visit | Attending: Family Medicine | Admitting: Family Medicine

## 2020-04-22 ENCOUNTER — Other Ambulatory Visit: Payer: Self-pay

## 2020-04-22 ENCOUNTER — Ambulatory Visit: Payer: BC Managed Care – PPO

## 2020-04-22 DIAGNOSIS — Z1231 Encounter for screening mammogram for malignant neoplasm of breast: Secondary | ICD-10-CM

## 2020-12-26 IMAGING — MG DIGITAL SCREENING BILAT W/ TOMO W/ CAD
8 series · 8 of 24 positions shown · non-contrast
Comparison: Previous exam(s).

CLINICAL DATA: Screening.

EXAM:
DIGITAL SCREENING BILATERAL MAMMOGRAM WITH TOMO AND CAD

[R MLO synth-2D]
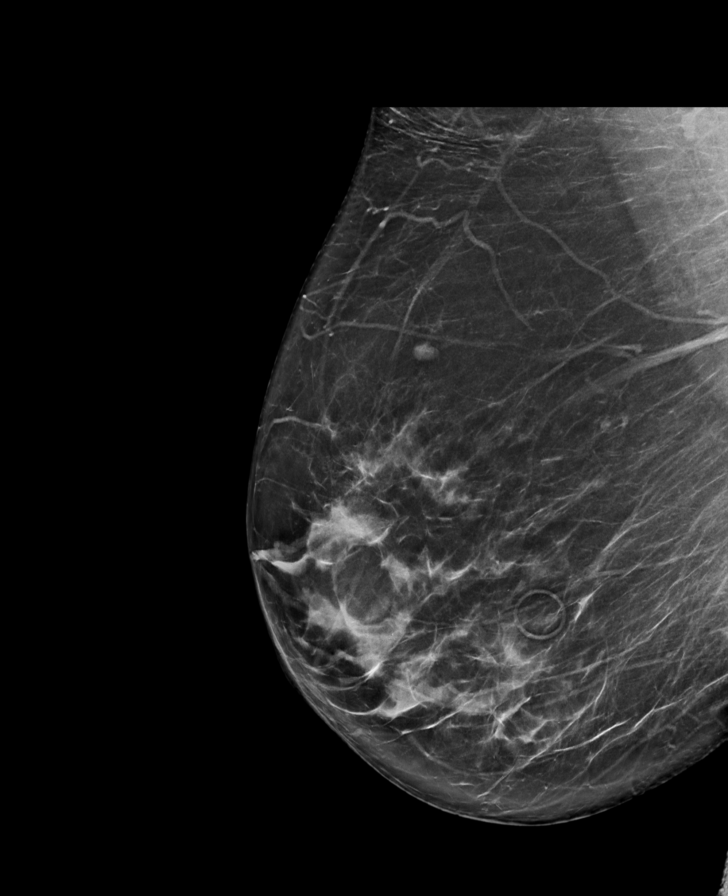

[L CC synth-2D]
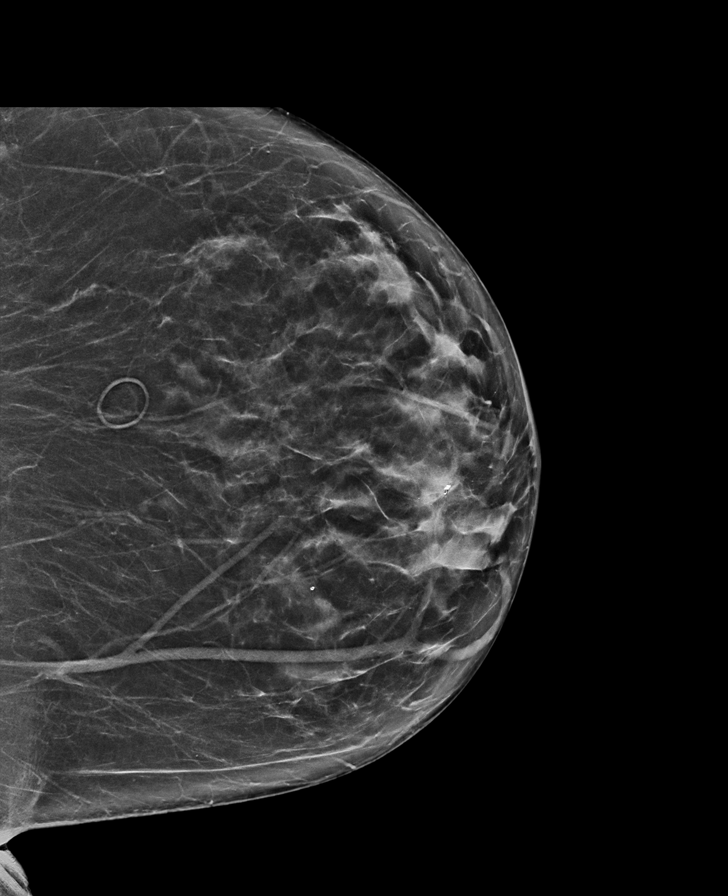

[R CC synth-2D]
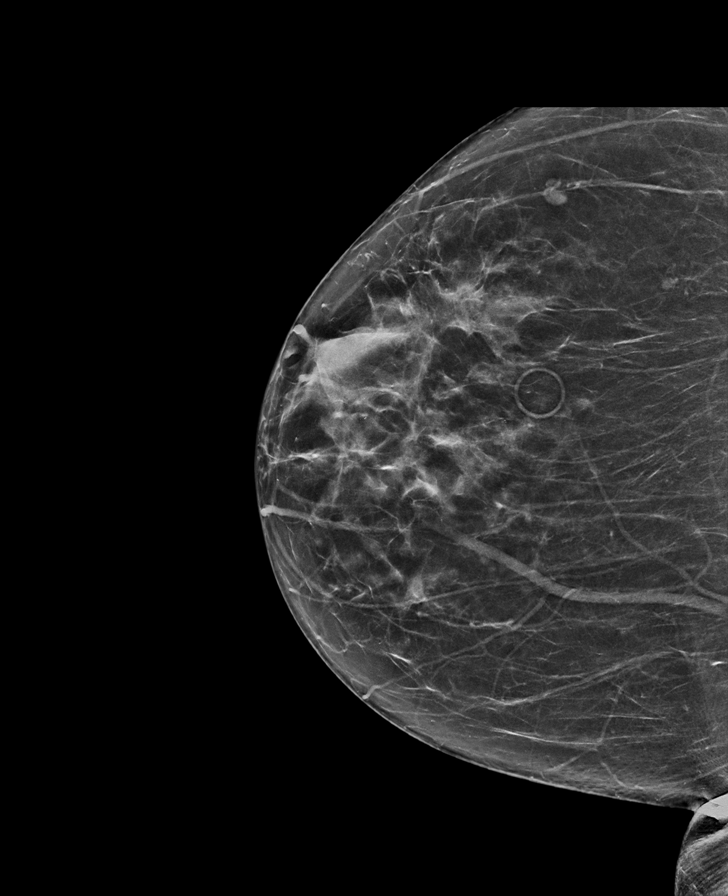

[L MLO synth-2D]
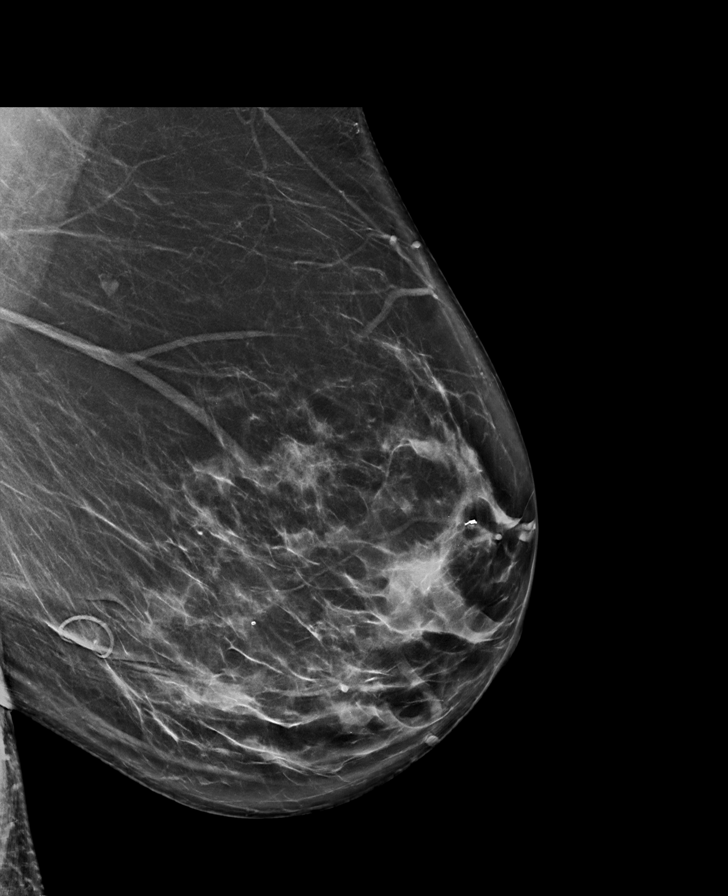

[R MLO tomo · tomo slice 42/83.0]
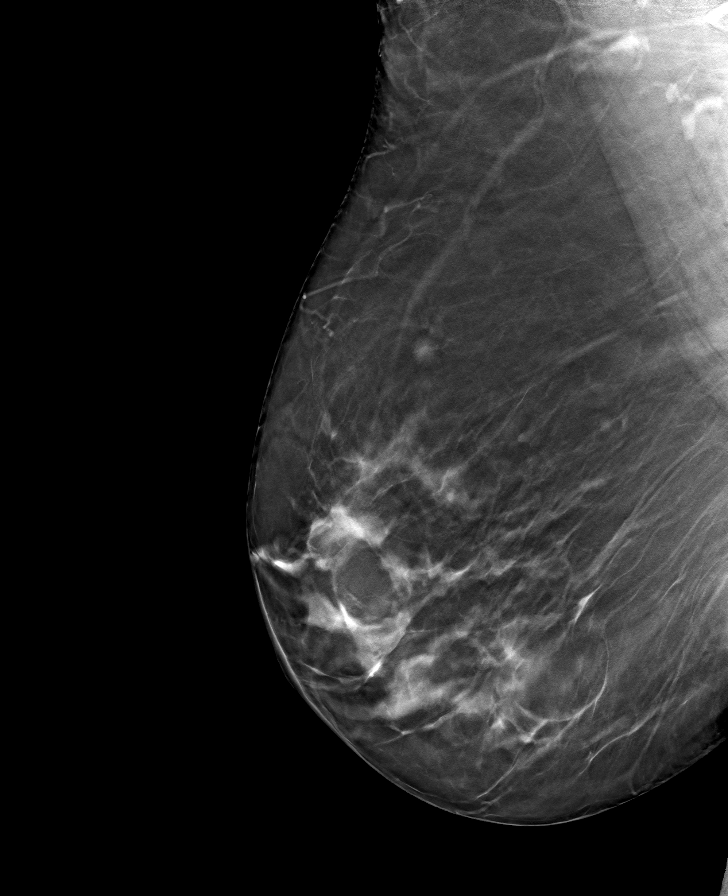

[R CC tomo · tomo slice 35/69.0]
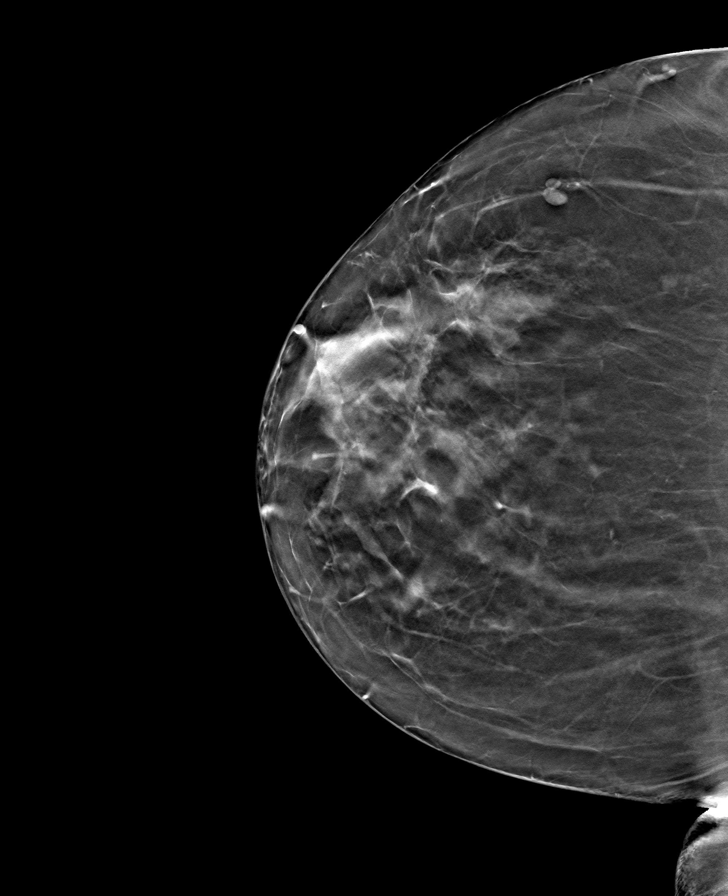

[L CC tomo · tomo slice 37/72.0]
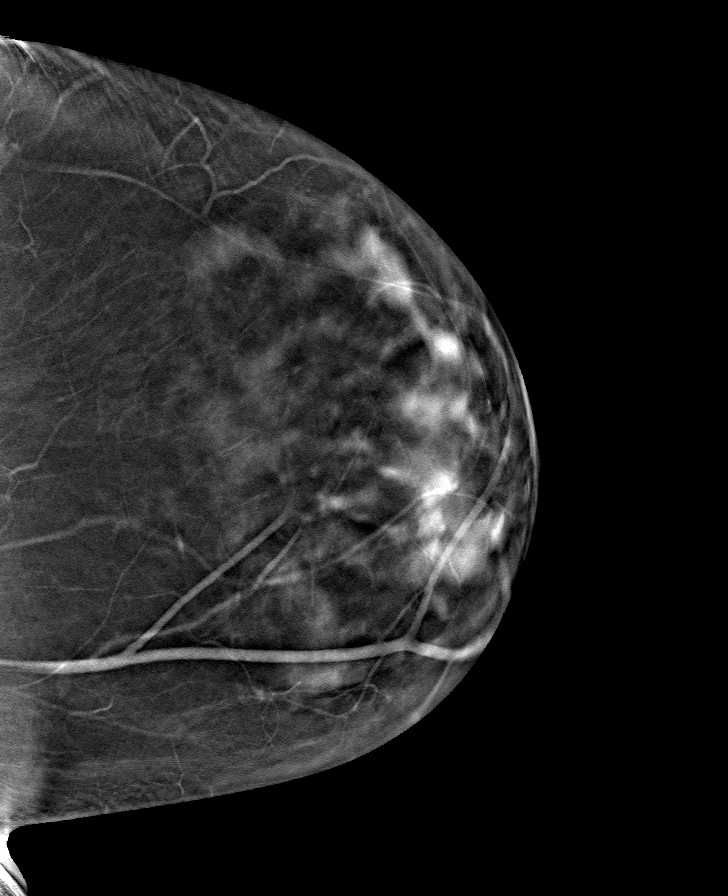

[L MLO tomo · tomo slice 43/84.0]
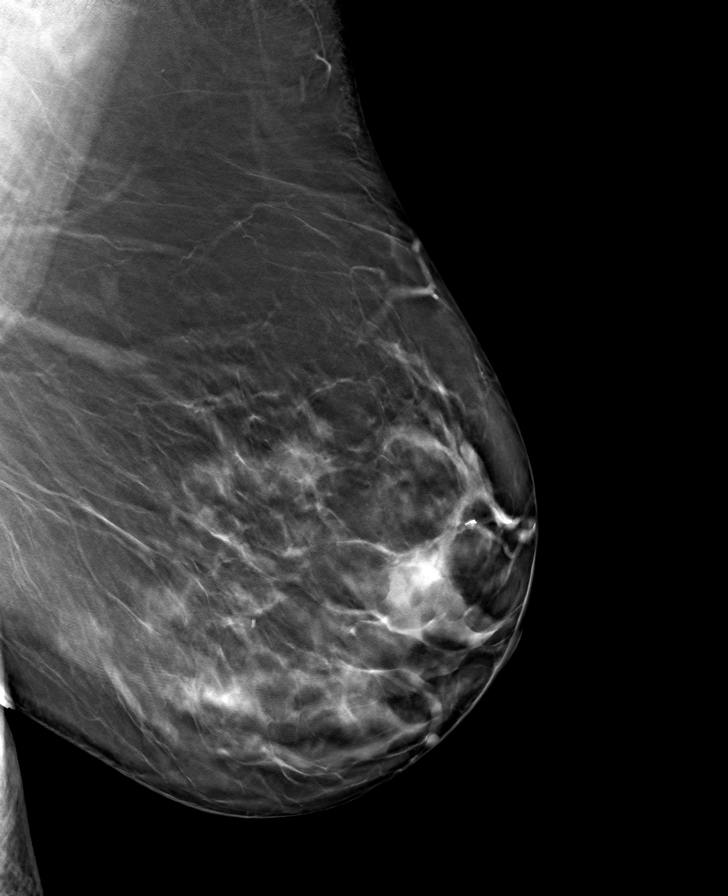

[8 of 24 positions shown; findings below may reference images not displayed]

ACR Breast Density Category c: The breast tissue is heterogeneously
dense, which may obscure small masses.
FINDINGS: There are no findings suspicious for malignancy. Images were
processed with CAD.
IMPRESSION: No mammographic evidence of malignancy. A result letter of this
screening mammogram will be mailed directly to the patient.

RECOMMENDATION:
Screening mammogram in one year. (Code:[id number]-LHB)

BI-RADS CATEGORY  1: Negative.

## 2021-02-04 ENCOUNTER — Other Ambulatory Visit: Payer: Self-pay | Admitting: Family Medicine

## 2021-02-04 DIAGNOSIS — Z1231 Encounter for screening mammogram for malignant neoplasm of breast: Secondary | ICD-10-CM

## 2021-05-11 ENCOUNTER — Ambulatory Visit: Payer: BC Managed Care – PPO

## 2021-06-16 ENCOUNTER — Ambulatory Visit
Admission: RE | Admit: 2021-06-16 | Discharge: 2021-06-16 | Disposition: A | Discharge status: Home / Self Care | Payer: 59 | Source: Ambulatory Visit | Attending: Family Medicine | Admitting: Family Medicine

## 2021-06-16 DIAGNOSIS — Z1231 Encounter for screening mammogram for malignant neoplasm of breast: Secondary | ICD-10-CM
